# Patient Record
Sex: Male | Born: 1955 | Race: White | Hispanic: No | Marital: Married | Smoking: Former smoker
Health system: Southern US, Community
[De-identification: ages and names within clinical notes are randomized; demographics above are authoritative.]

## PROBLEM LIST (undated history)

## (undated) DIAGNOSIS — I429 Cardiomyopathy, unspecified: Secondary | ICD-10-CM

## (undated) DIAGNOSIS — I10 Essential (primary) hypertension: Secondary | ICD-10-CM

## (undated) DIAGNOSIS — E119 Type 2 diabetes mellitus without complications: Secondary | ICD-10-CM

## (undated) DIAGNOSIS — I2510 Atherosclerotic heart disease of native coronary artery without angina pectoris: Secondary | ICD-10-CM

## (undated) DIAGNOSIS — E785 Hyperlipidemia, unspecified: Secondary | ICD-10-CM

## (undated) DIAGNOSIS — I4892 Unspecified atrial flutter: Secondary | ICD-10-CM

## (undated) HISTORY — DX: Type 2 diabetes mellitus without complications: E11.9

## (undated) HISTORY — DX: Essential (primary) hypertension: I10

## (undated) HISTORY — DX: Atherosclerotic heart disease of native coronary artery without angina pectoris: I25.10

## (undated) HISTORY — DX: Hyperlipidemia, unspecified: E78.5

## (undated) HISTORY — DX: Unspecified atrial flutter: I48.92

## (undated) HISTORY — DX: Cardiomyopathy, unspecified: I42.9

---

## 2007-12-22 HISTORY — PX: CORONARY ARTERY BYPASS GRAFT: SHX141

## 2008-01-05 ENCOUNTER — Ambulatory Visit: Payer: Self-pay | Admitting: Cardiology

## 2008-01-06 ENCOUNTER — Encounter: Payer: Self-pay | Admitting: Cardiothoracic Surgery

## 2008-01-06 ENCOUNTER — Ambulatory Visit: Payer: Self-pay | Admitting: Cardiology

## 2008-01-06 ENCOUNTER — Encounter: Payer: Self-pay | Admitting: Cardiology

## 2008-01-06 ENCOUNTER — Inpatient Hospital Stay (HOSPITAL_COMMUNITY): Admission: AD | Admit: 2008-01-06 | Discharge: 2008-01-12 | Payer: Self-pay | Admitting: Cardiology

## 2008-01-06 ENCOUNTER — Ambulatory Visit: Payer: Self-pay | Admitting: Cardiothoracic Surgery

## 2008-01-07 ENCOUNTER — Encounter: Payer: Self-pay | Admitting: Cardiothoracic Surgery

## 2008-01-14 ENCOUNTER — Emergency Department (HOSPITAL_COMMUNITY): Admission: EM | Admit: 2008-01-14 | Discharge: 2008-01-14 | Payer: Self-pay | Admitting: Emergency Medicine

## 2008-01-18 ENCOUNTER — Ambulatory Visit: Payer: Self-pay | Admitting: Cardiology

## 2008-01-19 ENCOUNTER — Encounter: Payer: Self-pay | Admitting: Cardiology

## 2008-01-19 ENCOUNTER — Encounter: Payer: Self-pay | Admitting: Physician Assistant

## 2008-02-01 ENCOUNTER — Encounter: Admission: RE | Admit: 2008-02-01 | Discharge: 2008-02-01 | Payer: Self-pay | Admitting: Cardiothoracic Surgery

## 2008-02-01 ENCOUNTER — Ambulatory Visit: Payer: Self-pay | Admitting: Cardiothoracic Surgery

## 2008-02-04 ENCOUNTER — Ambulatory Visit: Payer: Self-pay | Admitting: Cardiology

## 2008-02-10 ENCOUNTER — Encounter: Payer: Self-pay | Admitting: Cardiology

## 2008-06-23 ENCOUNTER — Ambulatory Visit: Payer: Self-pay | Admitting: Cardiology

## 2008-10-06 IMAGING — CR DG CHEST 2V
2 series · 2 of 2 positions shown · non-contrast
Comparison: 01/10/2008.

CLINICAL DATA: Unstable angina.  Status post CABG.

CHEST - 2 VIEW

[w chest pa]
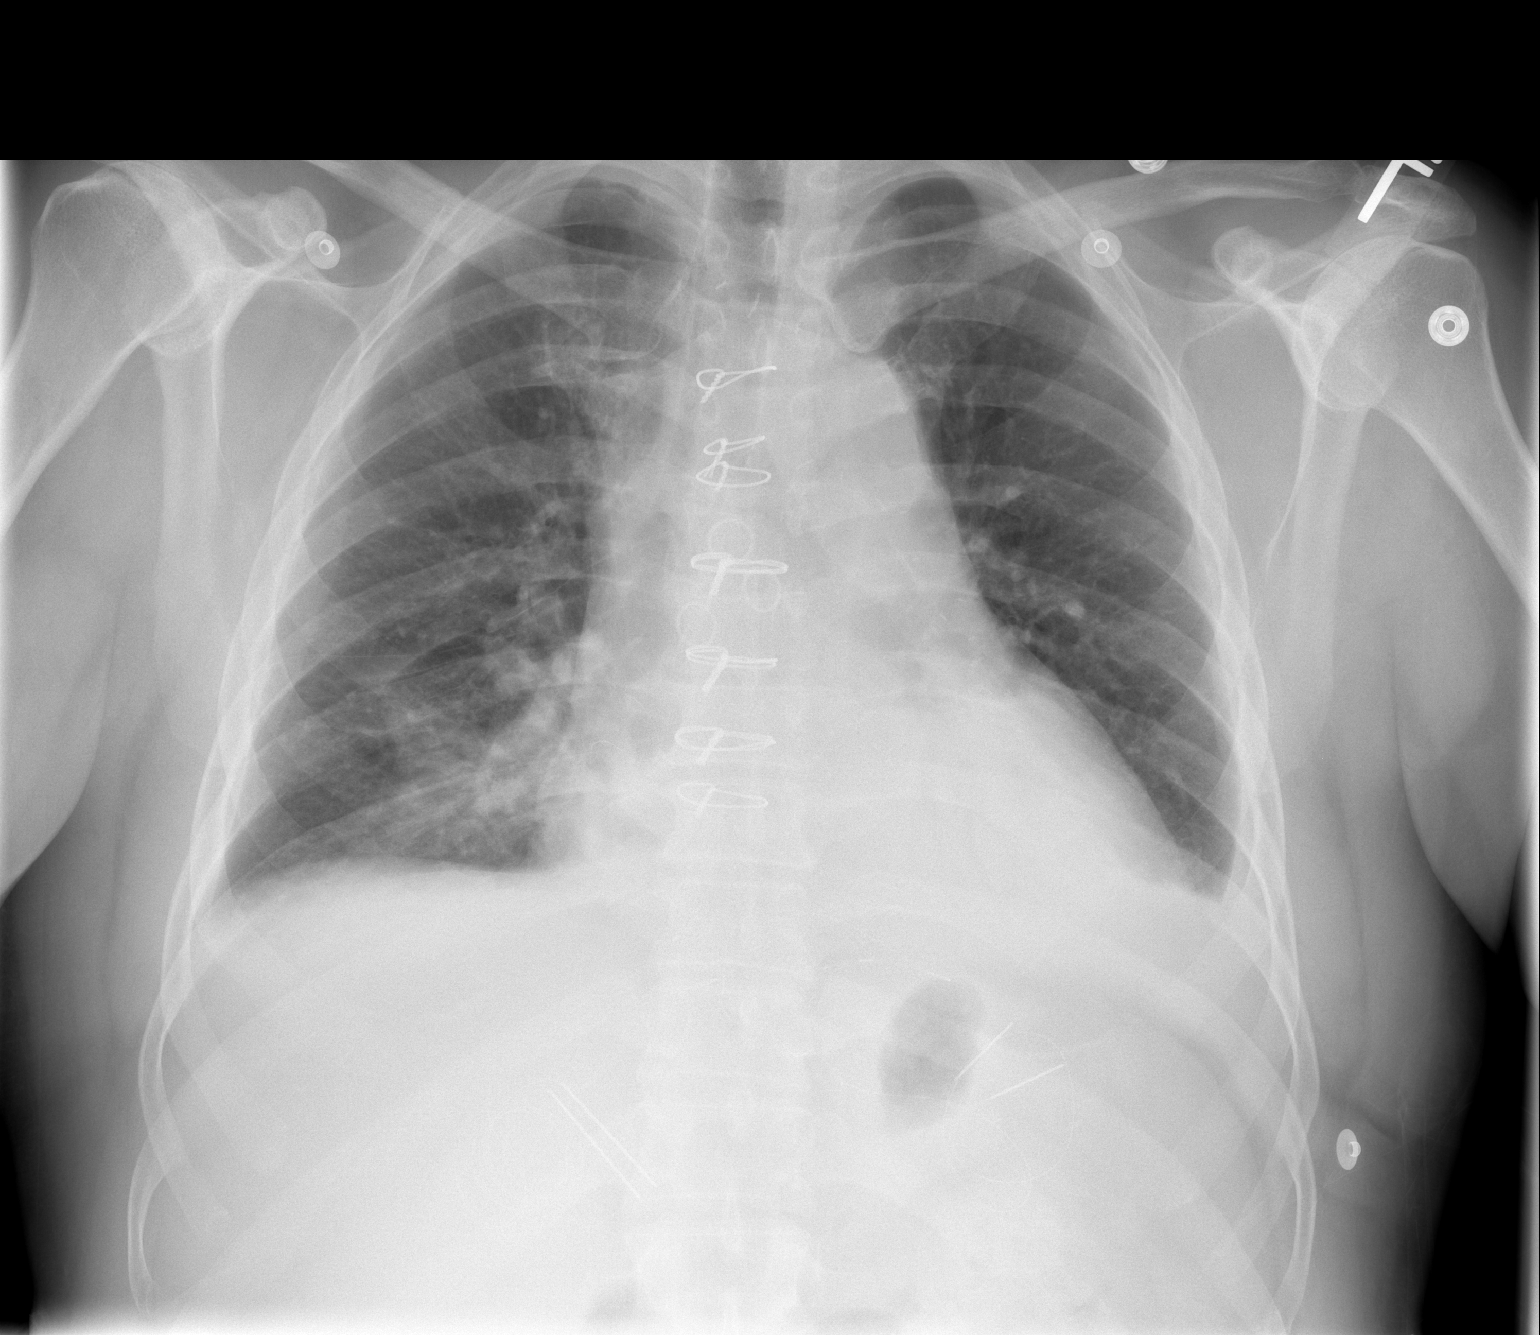

[w chest lat]
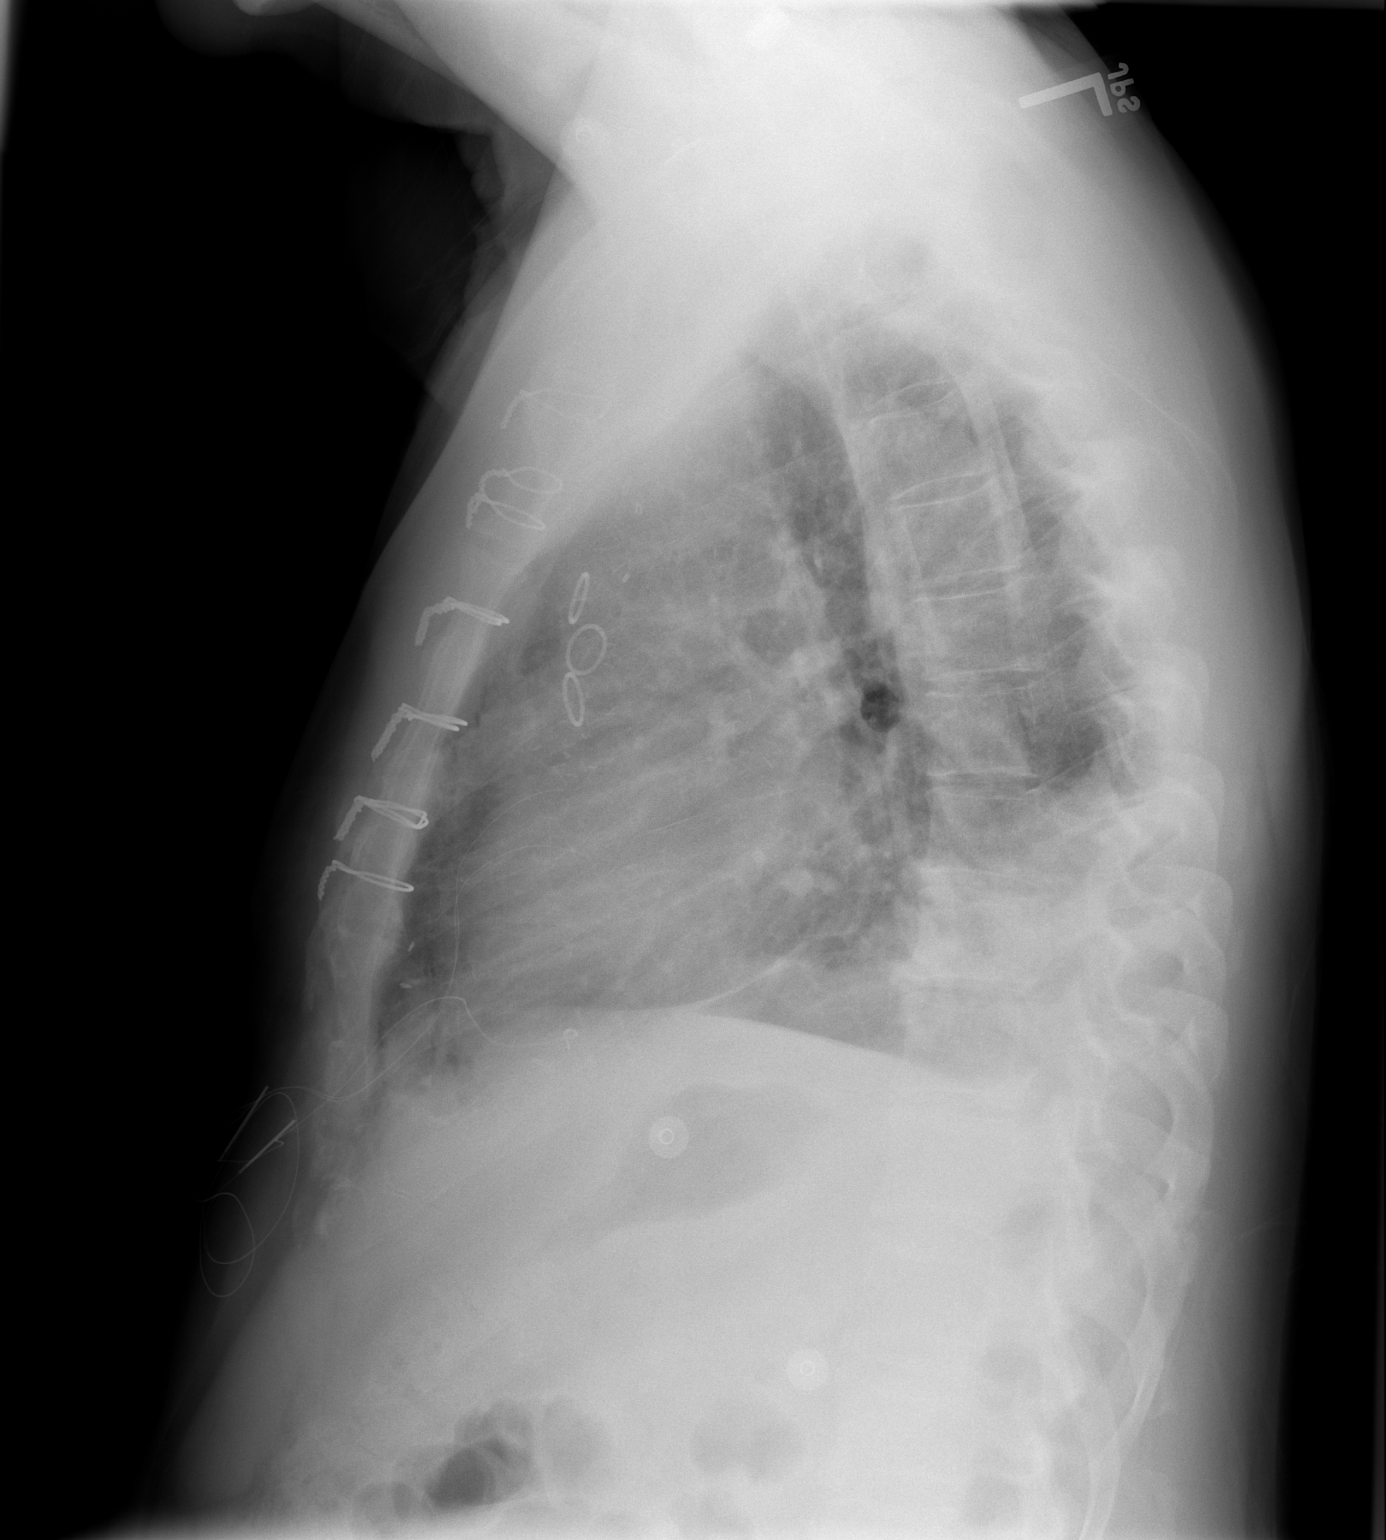

[2 of 2 positions shown; findings below may reference images not displayed]

FINDINGS: Small bilateral pleural effusions persist, left greater
than right.  There is some improvement on the left.  There is focal
consolidation in the posterior left lower lobe.  Air bronchograms
are present.  This most likely still represents atelectasis as
there is depression of the major fissure on the left.  Given the
air bronchograms, infection or even aspiration is not excluded.
External pacing wires remain.
IMPRESSION: 1.  Focal consolidation at the posterior left lung base.  This most
likely represents atelectasis but infection or aspiration is not
excluded.  Please see the above discussion.
2.  Small bilateral pleural effusions, left greater than right.
There is some improvement on the left.
3.  Minimal right basilar atelectasis.

## 2008-12-23 ENCOUNTER — Encounter: Payer: Self-pay | Admitting: Cardiology

## 2008-12-28 ENCOUNTER — Encounter (INDEPENDENT_AMBULATORY_CARE_PROVIDER_SITE_OTHER): Payer: Self-pay | Admitting: *Deleted

## 2009-01-04 DIAGNOSIS — I10 Essential (primary) hypertension: Secondary | ICD-10-CM | POA: Insufficient documentation

## 2009-01-04 DIAGNOSIS — E785 Hyperlipidemia, unspecified: Secondary | ICD-10-CM

## 2009-02-09 ENCOUNTER — Encounter: Payer: Self-pay | Admitting: Physician Assistant

## 2009-02-09 ENCOUNTER — Ambulatory Visit: Payer: Self-pay | Admitting: Cardiology

## 2009-05-12 ENCOUNTER — Encounter (INDEPENDENT_AMBULATORY_CARE_PROVIDER_SITE_OTHER): Payer: Self-pay | Admitting: *Deleted

## 2009-05-19 ENCOUNTER — Encounter: Payer: Self-pay | Admitting: Cardiology

## 2009-05-25 ENCOUNTER — Encounter (INDEPENDENT_AMBULATORY_CARE_PROVIDER_SITE_OTHER): Payer: Self-pay | Admitting: *Deleted

## 2009-08-31 ENCOUNTER — Encounter (INDEPENDENT_AMBULATORY_CARE_PROVIDER_SITE_OTHER): Payer: Self-pay | Admitting: *Deleted

## 2009-09-08 ENCOUNTER — Encounter: Payer: Self-pay | Admitting: Cardiology

## 2009-09-12 ENCOUNTER — Encounter (INDEPENDENT_AMBULATORY_CARE_PROVIDER_SITE_OTHER): Payer: Self-pay | Admitting: *Deleted

## 2009-10-06 ENCOUNTER — Ambulatory Visit: Payer: Self-pay | Admitting: Cardiology

## 2009-10-06 DIAGNOSIS — I2510 Atherosclerotic heart disease of native coronary artery without angina pectoris: Secondary | ICD-10-CM

## 2010-02-28 ENCOUNTER — Encounter: Payer: Self-pay | Admitting: Cardiology

## 2010-03-02 ENCOUNTER — Encounter: Payer: Self-pay | Admitting: Cardiology

## 2010-03-05 ENCOUNTER — Encounter (INDEPENDENT_AMBULATORY_CARE_PROVIDER_SITE_OTHER): Payer: Self-pay | Admitting: *Deleted

## 2010-04-02 ENCOUNTER — Encounter: Payer: Self-pay | Admitting: Cardiology

## 2010-04-02 ENCOUNTER — Ambulatory Visit: Payer: Self-pay | Admitting: Cardiology

## 2010-05-22 NOTE — Miscellaneous (Signed)
Summary: Orders Update  Clinical Lists Changes  Orders: Added new Test order of T-Lipid Profile ([id number]) - Signed Added new Test order of T-Hepatic Function ([id number]) - Signed 

## 2010-05-22 NOTE — Assessment & Plan Note (Signed)
Summary: 1 YR FU PER JUNE REMINDER-SRS   Visit Type:  Follow-up Primary Provider:  Dr. Doreen Beam   History of Present Illness: 55 year old male presents for followup. He is here with his wife. He denies any significant problems of angina or unusual breathlessness. He reports compliance with medications.  Followup labs from 20 May reveal AST 19, ALT 25, cholesterol 128, triglycerides 61, HDL 31, LDL 85. We reviewed these today. These levels were noted on increased dose Lipitor at 40 mg daily. He states that he has had some weakness and myalgias on the higher dose.  He reports no problems with palpitations or syncope. Appetite stable.  Preventive Screening-Counseling & Management  Alcohol-Tobacco     Smoking Status: quit     Year Quit: 1985  Current Medications (verified): 1)  Metoprolol Tartrate 25 Mg Tabs (Metoprolol Tartrate) .... Take 1 Tablet By Mouth Twice A Day 2)  Lipitor 40 Mg Tabs (Atorvastatin Calcium) .... Take One Tablet By Mouth Daily. 3)  Lisinopril 2.5 Mg Tabs (Lisinopril) .... Take 1 Tablet By Mouth Once A Day 4)  Glucovance 5-500 Mg Tabs (Glyburide-Metformin) .... Take 2 Tablet By Mouth Two Times A Day 5)  Aspirin 81 Mg Tbec (Aspirin) .... Take One Tablet By Mouth Daily 6)  Allegra 180 Mg Tabs (Fexofenadine Hcl) .... Take 1 Tablet By Mouth Once A Day 7)  Fish Oil 1000 Mg Caps (Omega-3 Fatty Acids) .... Take 1 Capsule By Mouth Two Times A Day 8)  Protonix 40 Mg Tbec (Pantoprazole Sodium) .... Take 1 Tablet By Mouth Once A Day  Allergies (verified): 1)  * Lasix 2)  Potassium (Potassium)  Comments:  Nurse/Medical Assistant: The patient's medication list and allergies were reviewed with the patient and were updated in the Medication and Allergy Lists.  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 Hemodynamics; aorta 115/66, left ventricle 116/4/11   2. Left ventriculogram: EF is 60% with no wall motion abnormalities.   3. Coronary angiography, the coronary system is right dominant.  There       is a 30%-40% proximal RCA lesion and a 50%-60% mid RCA lesion.  The       left main has no significant disease.  The proximal LAD has a long       diseased segment with stenosis up to 70% that extends from the       ostial LAD down past the second diagonal.  The first diagonal has a       99% proximal stenosis and the second diagonal which is a smaller       vessel has a 90% proximal stenosis.  More distally, the LAD has a       40%-50% stenosis.  In the circumflex system, the first obtuse       marginal has serial proximal 60% stenoses and the second obtuse       marginal has a proximal 60% stenosis and a more mid 50% stenosis.       The third obtuse marginal was a smaller vessel with the proximal       70% stenosis.  The distal AV circumflex is subtotally occluded.  ASSESSMENT AND PLAN:  This is a diabetic who presented with unstable   angina and was found to have severe coronary artery disease.  There is a   proximal long left anterior descending coronary artery stenosis that   reaches up to at least 70% along with severe stenoses in the first and   second diagonals and moderate stenoses in several obtuse marginals.   Given the patient's risk factors, symptoms, and diffuse disease with   reasonable distal targets, I think coronary artery bypass grafting would   be his best option.  We will consult the CT surgery service.      Marca Ancona, MD  (01/06/2008)    Family History: Family History of Coronary Artery Disease  Social History: Full Time Married  Tobacco Use - Former Alcohol  Use - no  Review of Systems  The patient denies anorexia, fever, chest pain, syncope, dyspnea on exertion, peripheral edema, headaches, melena, and hematochezia.         Otherwise reviewed and negative except as outlined.  Vital Signs:  Patient profile:   55 year old male Height:      65 inches Weight:      176 pounds Pulse rate:   66 / minute BP sitting:   121 / 75  (left arm) Cuff size:   large  Vitals Entered By: Carlye Grippe (October 06, 2009 3:21 PM)  Physical Exam  Additional Exam:  Normally nourished appearing male in no acute distress. HEENT: Conjunctiva and lids normal, oropharynx with moist mucosa. Neck: Supple, no elevated JVP. Cardiac: Regular rate and rhythm, no S3. Thorax: Stable sternum post sternotomy. Abdomen: Soft, nontender, bowel sounds present. Skin: Warm and dry. Extremities: No pitting edema. Musculoskeletal: No gross deformities. Neuropsychiatric: Alert and oriented x3, affect appropriate.   EKG  Procedure date:  10/06/2009  Findings:      Normal sinus rhythm at 63 beats per minute.  Impression & Recommendations:  Problem # 1:  CORONARY ATHEROSCLEROSIS NATIVE CORONARY ARTERY (ICD-414.01)  Symptomatically stable, approximately 2 years out from bypass grafting. He is doing well on present medications. I encouraged him to continue regular exercise and diet. I will see him back in 6 months.  His updated medication list for this problem includes:    Metoprolol Tartrate 25 Mg Tabs (Metoprolol tartrate) .Marland Kitchen... Take 1 tablet by mouth twice a day    Lisinopril 2.5 Mg Tabs (Lisinopril) .Marland Kitchen... Take 1 tablet by mouth once a day    Aspirin 81 Mg Tbec (Aspirin) .Marland Kitchen... Take one tablet by mouth daily  Orders: EKG w/ Interpretation (93000)  Problem # 2:  HYPERTENSION, UNSPECIFIED (ICD-401.9)  Blood pressure well-controlled.  His updated medication list for this problem includes:    Metoprolol Tartrate 25 Mg Tabs (Metoprolol tartrate) .Marland Kitchen... Take 1 tablet  by mouth twice a day    Lisinopril 2.5 Mg Tabs (Lisinopril) .Marland Kitchen... Take 1 tablet by mouth once a day    Aspirin 81 Mg Tbec (Aspirin) .Marland Kitchen... Take one tablet by mouth daily  Problem # 3:  HYPERLIPIDEMIA-MIXED (ICD-272.4)  Better controlled on Lipitor 40 mg daily, however he is reporting increased weakness and myalgias. I asked him to cut back to 20 mg daily. Will repeat lipid profile and liver function tests in 12 weeks. If LDL is not in good order, we may need to switch to a different statin preparation.  His updated medication list for this problem includes:    Lipitor 20 Mg Tabs (Atorvastatin calcium) .Marland Kitchen... Take one tablet by  mouth daily.  Patient Instructions: 1)  Your physician wants you to follow-up in: 6 months. You will receive a reminder letter in the mail one-two months in advance. If you don't receive a letter, please call our office to schedule the follow-up appointment. 2)  Decrease Lipitor to 20mg  by mouth per day. You may take 1/2 of your 40mg  tablets.  3)  Your physician recommends that you go to the [facility] for a FASTING lipid profile and liver function labs: DO LABS IN 12 WEEKS. Prescriptions: LIPITOR 20 MG TABS (ATORVASTATIN CALCIUM) Take one tablet by mouth daily.  #30 x 6   Entered by:   Cyril Loosen, RN, BSN   Authorized by:   Loreli Slot, MD, [facility]   Signed by:   Cyril Loosen, RN, BSN on 10/06/2009   Method used:   Electronically to        [facility] [address] (retail)       [address]       [place], [place]  [address]       Ph: [phone] or [phone]       Fax: [phone]   RxID:   [id number]

## 2010-05-22 NOTE — Miscellaneous (Signed)
Summary: FLP/LFT  Clinical Lists Changes  Orders: Added new Test order of T-Hepatic Function [phone]) - Signed Added new Test order of T-Lipid Profile [phone]) - Signed

## 2010-05-22 NOTE — Letter (Signed)
Summary: Generic Engineer, agricultural at [facility] [address]   [place], [place] [address]   Phone: [phone]  Fax: [phone]        May 25, 2009 MRN: [id number]    [name] [address] [place], [place]  [address]    Dear Mr. CRESCENZO,   Please, contact our office as soon as possible regarding your test results. We attempted to reach you by phone, but apparently, we do not have a correct contact number for you.        Sincerely,  Cyril Loosen, RN, BSN  This letter has been electronically signed by your physician.

## 2010-05-22 NOTE — Letter (Signed)
Summary: Engineer, materials at [facility]  [address]   [place], [place] [address]   Phone: [phone]  Fax: [phone]        Sep 12, 2009 MRN: [id number]    [facility] [address] [place], [place]  [address]    Dear Mr. CARLISI,  Your test ordered by Selena Batten has been reviewed by your physician (or physician assistant) and was found to be normal or stable. Your physician (or physician assistant) felt no changes were needed at this time.  ____ Echocardiogram  ____ Cardiac Stress Test  _X___ Lab Work-LDL (bad cholesterol) looks much better. Liver function looks okay. Continue same medications.  ____ Peripheral vascular study of arms, legs or neck  ____ CT scan or X-ray  ____ Lung or Breathing test  ____ Other:   Thank you.   Cyril Loosen, RN, BSN    Duane Boston, M.D., F.A.C.C. Thressa Sheller, M.D., F.A.C.C. Oneal Grout, M.D., F.A.C.C. Cheree Ditto, M.D., F.A.C.C. Daiva Nakayama, M.D., F.A.C.C. Kenney Houseman, M.D., F.A.C.C. Jeanne Ivan, PA-C

## 2010-05-22 NOTE — Miscellaneous (Signed)
Summary: FLP/LFT  Clinical Lists Changes  Orders: Added new Test order of T-Hepatic Function ([id number]) - Signed Added new Test order of T-Lipid Profile ([id number]) - Signed 

## 2010-05-22 NOTE — Letter (Signed)
Summary: Engineer, materials at [facility]  [address]   [place], [place] [address]   Phone: [phone]  Fax: [phone]        March 05, 2010 MRN: [id number]   [facility] [address] [place], [place]  [address]   Dear Mr. WEANT,  Your test ordered by Selena Batten has been reviewed by your physician (or physician assistant) and was found to be normal or stable. Your physician (or physician assistant) felt no changes were needed at this time.  ____ Echocardiogram  ____ Cardiac Stress Test  __X__ Lab Work  ____ Peripheral vascular study of arms, legs or neck  ____ CT scan or X-ray  ____ Lung or Breathing test  ____ Other:   Thank you.   Cyril Loosen, RN, BSN    Duane Boston, M.D., F.A.C.C. Thressa Sheller, M.D., F.A.C.C. Oneal Grout, M.D., F.A.C.C. Cheree Ditto, M.D., F.A.C.C. Daiva Nakayama, M.D., F.A.C.C. Kenney Houseman, M.D., F.A.C.C. Jeanne Ivan, PA-C

## 2010-05-24 NOTE — Assessment & Plan Note (Signed)
Summary: 6 MO FUL   Visit Type:  Follow-up Primary Provider:  Dr. Doreen Beam   History of Present Illness: 55 year old male presents for followup. He was seen back in June. Reports doing well without angina or unusual shortness of breath.  Followup labs from 11 November showed AST 20, ALT 26, cholesterol 132, triglycerides 157, HDL 31, LDL 70. We reviewed these.  Patient reports compliance with his medications. 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  Has mainly arthritic/orthopedic complaints.  Preventive Screening-Counseling & Management  Alcohol-Tobacco     Smoking Status: quit     Year Quit: 1985  Current Medications (verified): 1)  Metoprolol Tartrate 25 Mg Tabs (Metoprolol Tartrate) .... Take 1 Tablet By Mouth Twice A Day 2)  Lipitor 20 Mg Tabs (Atorvastatin Calcium) .... Take One Tablet By Mouth Daily. 3)  Lisinopril 5 Mg Tabs (Lisinopril) .... Take 1 Tablet By Mouth Once A Day 4)  Glucovance 5-500 Mg Tabs (Glyburide-Metformin) .... Take 2 Tablet By Mouth Two Times A Day 5)  Aspirin 81 Mg Tbec (Aspirin) .... Take One Tablet By Mouth Daily 6)  Allegra 180 Mg Tabs (Fexofenadine Hcl) .... Take 1 Tablet By Mouth Once A Day As Needed 7)  Fish Oil 1000 Mg Caps (Omega-3 Fatty Acids) .... Take 1 Capsule By Mouth Two Times A Day 8)  Protonix 40 Mg Tbec (Pantoprazole Sodium) .... Take 1 Tablet By Mouth Once A Day 9)  Januvia 50 Mg Tabs (Sitagliptin Phosphate) .... Take 1 Tablet By Mouth Two Times A Day 10)  Novolog Flexpen 100 Unit/ml Soln (Insulin Aspart) .... Use As Directed  Allergies (verified): 1)  * Lasix 2)  Potassium (Potassium)  Comments:  Nurse/Medical Assistant: The patient's medications and allergies were verbally reviewed with the patient and were updated in the Medication and Allergy Lists.  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         Otherwise reviewed and negative except as outlined.  Vital Signs:  Patient profile:   55 year old male Height:      65 inches Weight:      177 pounds BMI:     29.56 Pulse rate:   80 / minute BP sitting:   138 / 75  (left arm) Cuff size:   regular  Vitals Entered By: Carlye Grippe (April 02, 2010 3:46 PM)  Nutrition Counseling: Patient's BMI is greater than 25 and therefore counseled on weight management options.  Physical Exam  Additional Exam:  Normally nourished appearing male in no acute distress. HEENT: Conjunctiva and lids normal, oropharynx with moist mucosa. Neck: Supple, no elevated JVP. Cardiac: Regular rate and rhythm, no S3. Thorax: Stable sternum post sternotomy. Abdomen: Soft, nontender, bowel sounds present. Skin: Warm and dry. Extremities: No pitting edema. Musculoskeletal: No gross deformities. Neuropsychiatric: Alert and oriented x3, affect appropriate.   EKG  Procedure date:  04/02/2010  Findings:      Sinus rhythm at 70 beats per minute.  Impression & Recommendations:  Problem # 1:  CORONARY ATHEROSCLEROSIS NATIVE CORONARY ARTERY (ICD-414.01)  Stable  symptomatically, doing very well. No medication changes made. I will see him back in 6 months.  His updated medication list for this problem includes:    Metoprolol Tartrate 25 Mg Tabs (Metoprolol tartrate) .Marland Kitchen... Take 1 tablet by mouth twice a day    Lisinopril 5 Mg Tabs (Lisinopril) .Marland Kitchen... Take 1 tablet by mouth once a day    Aspirin 81 Mg Tbec (Aspirin) .Marland Kitchen... Take one tablet by mouth daily  Orders: EKG w/ Interpretation (93000)  Problem #  2:  HYPERTENSION, UNSPECIFIED (ICD-401.9)  Blood pressure up some today, although typically much better controlled. Continue exercise, sodium restriction.  His updated medication list for this problem includes:    Metoprolol Tartrate 25 Mg Tabs (Metoprolol tartrate) .Marland Kitchen... Take 1 tablet by mouth twice a day    Lisinopril 5 Mg Tabs (Lisinopril) .Marland Kitchen... Take 1 tablet by mouth once a day    Aspirin 81 Mg Tbec (Aspirin) .Marland Kitchen... Take one tablet by mouth daily  Problem # 3:  HYPERLIPIDEMIA-MIXED (ICD-272.4)  Lipids looked good back in November. Followup fasting lipid profile and liver function tests for next visit.  His updated medication list for this problem includes:    Lipitor 20 Mg Tabs (Atorvastatin calcium) .Marland Kitchen... Take one tablet by mouth daily.  Patient Instructions: 1)  Your physician wants you to follow-up in: 6 months. You will receive a reminder letter in the mail one-two months in advance. If you don't receive a letter, please call our office to schedule the follow-up appointment. 2)  Your physician recommends that you continue on your current medications as directed. Please refer to the Current Medication list given to you today. 3)  Your physician recommends that you go to the [facility] for a FASTING lipid profile and liver function labs:  DO IN 6 MONTHS BEFORE APPT. Do not eat or drink after midnight.

## 2010-07-18 ENCOUNTER — Other Ambulatory Visit: Payer: Self-pay | Admitting: *Deleted

## 2010-07-18 DIAGNOSIS — I2510 Atherosclerotic heart disease of native coronary artery without angina pectoris: Secondary | ICD-10-CM

## 2010-07-18 MED ORDER — METOPROLOL TARTRATE 25 MG PO TABS: 25.0000 mg | ORAL_TABLET | Freq: Two times a day (BID) | ORAL | Status: DC

## 2010-07-18 NOTE — Telephone Encounter (Signed)
Wife informed that will be sent to pharmacy

## 2010-08-22 ENCOUNTER — Other Ambulatory Visit: Payer: Self-pay | Admitting: *Deleted

## 2010-08-22 ENCOUNTER — Telehealth: Payer: Self-pay | Admitting: *Deleted

## 2010-08-22 DIAGNOSIS — E785 Hyperlipidemia, unspecified: Secondary | ICD-10-CM

## 2010-08-22 DIAGNOSIS — Z79899 Other long term (current) drug therapy: Secondary | ICD-10-CM

## 2010-08-22 NOTE — Telephone Encounter (Signed)
Pt's wife left message on voicemail stating that pt needs labs done before June appt. She would like order sent to the [facility] and a call to notify her when this is done.   Left message to notify pt's wife that order is being sent and that he needs to go to the [facility] towards the end of May (fasting) to have labs done.

## 2010-09-04 NOTE — Assessment & Plan Note (Signed)
OFFICE VISIT   [name], [name]  DOB:  1955/10/18                                        February 01, 2008  CHART #:  [id number]   The patient comes in today for postoperative followup.  He is status  post CABG x6 on January 08, 2008.  His postoperative course was  uneventful and he was able to be discharged home on January 12, 2008,  without complications.  Since his discharge, he has continued to  progress nicely.  He states he is walking frequently at home and is  having no shortness of breath or chest discomfort.  His only problem  since returning home was severe nausea and vomiting, which seemed to be  related to the potassium supplement.  He was discharged home on Lasix  and potassium for 5 days and probably completed 3 days' worth of both.  However, he was becoming so sick after taking the medication that he  contacted Dr. Margarita Mail office and was told to discontinue the  medication.  He has not noted any increase in his weight, increased  shortness of breath, or lower extremity edema.  His weight has actually  been trending downward since his discharge home.  He does have an  appointment to see Dr. Andee Lineman this week and a followup appointment with  his primary care physician next week.   PHYSICAL EXAMINATION:  VITAL SIGNS:  Blood pressure is 114/71, pulse is  67, respirations 18, and O2 sat 99% on room air.  CHEST:  His sternal incision is well healed.  He does have a small  suture tag at the midportion of his incision, which is removed without  difficulty.  There is no erythema or drainage.  His right lower  extremity EVH incisions are well healed.  HEART:  Regular rate and rhythm without murmurs, rubs, or gallops.  LUNGS:  Clear, although breath sounds was slightly diminished in the  left base.  EXTREMITIES:  There is trace right lower extremity edema and no edema on  the left.   Chest x-ray today shows a small left pleural effusion, but this  is  markedly improved from a previous study.   ASSESSMENT AND PLAN:  The patient is doing very well, status post  coronary artery bypass graft.  He has asked for a prescription for  Lipitor as he is about to run out.  He does have an appointment to see  Dr. Andee Lineman this week and I have given him a prescription for 30 Lipitor  20 mg tablets until he gets back in with the cardiologist.  I have  encouraged him to proceed with cardiac rehab in which he is already  enrolled in.  We have discussed his returning to work.  He has been  working on a part-time temporary basis for the [employer], which does involve some outdoor work, including lifting.  He  feels that he may be able to obtain a light duty position until his  sternal restrictions are listed.  He will look into this and let us know  if he needs a note for work.  Otherwise, he is doing very well and we  will plan to see him back on an as-needed basis.  He is asked to call if  he experiences any problems or has questions.  Sheliah Plane, MD  Electronically Signed   GC/MEDQ  D:  02/01/2008  T:  02/02/2008  Job:  [id number]   cc:   Learta Codding, MD,FACC  Doreen Beam, MD

## 2010-09-04 NOTE — Op Note (Signed)
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   POSTOPERATIVE DIAGNOSIS:  Coronary artery occlusive disease with  unstable angina.   SURGICAL PROCEDURE:  Coronary artery bypass grafting x6 with the left  internal mammary to the left anterior descending coronary artery, triple  sequential reverse saphenous vein graft to the first diagonal, first  obtuse marginal and second obtuse marginal reverse saphenous vein graft  to the second diagonal coronary artery, and reverse saphenous vein graft  to the posterior descending coronary artery with right thigh endovein  harvesting.   SURGEON:  Sheliah Plane, MD   FIRST ASSISTANT:  Stephanie Acre. Dominick, PA   BRIEF HISTORY:  The patient is a 55 year old male who presents with  unstable anginal symptoms.  He underwent cardiac catheterization by  [facility] and was found to have severe three-vessel coronary  artery disease with high-grade obstruction of his LAD, first and second  diagonal, circumflex, and right coronary artery.  Coronary artery bypass  grafting was recommended to the patient who agreed and signed informed  consent.   DESCRIPTION OF THE PROCEDURE:  With Swan-Ganz, arterial line, and  monitors in place, the patient underwent general endotracheal anesthesia  without incidence given the chest and legs prepped with Betadine and  draped in usual sterile manner.  Using the Guidant endovein harvesting  system, vein was harvested from the right thigh and lower leg.  Vein was  of good quality and caliber.  Median sternotomy was  performed.  Left  internal mammary artery was dissected down as a pedicle graft.  The  distal artery was divided, had good free flow.  Pericardium was opened.  Overall, ventricular function appeared preserved.  The patient was  systemically heparinized.  The ascending aorta was cannulated and aortic  root vent, cardioplegia needle was introduced into the ascending aorta  and the right atrium cannulated.  The patient was placed on  cardiopulmonary bypass at 2.4 L per minute per meter squared.  Sites of  anastomosis were inspected and dissected out of the epicardium.  The  patient's body temperature was cooled to 30 degrees.  Aortic cross-clamp  was applied.  A 500 mL of cold blood potassium cardioplegia was  administered with rapid diastolic arrest of the heart.  Myocardial  septal temperature was monitored throughout the cross-clamp.  Attention  was turned first to the posterior descending coronary artery, which was  opened, was diffusely diseased, a small poor quality vessel.  Using a  running 7-0 Prolene, distal anastomosis was performed.  Attention was  then turned to the left lateral wall.  The first diagonal, which was  high lateral wall was moderate-size vessel admitted a 1.5-mm probe.  The  first obtuse marginal, second obtuse marginals were small vessels.  The  distal circumflex was very small, not suitable for bypass.  The  alignment of the vessels made it most feasible to do a triple sequential  graft.  With these, a first obtuse marginal artery was bypassed first  with a side-to-side anastomosis with second reverse saphenous vein  graft.  The distal extent of this same vein was then trimmed and  anastomosed to the second obtuse marginal.  An 8-0 Prolene was used  because of the small size and thinness of each of the vessels.  A second  side-to-side anastomosis with the first diagonal was performed again  with a running 8-0 Prolene.  Additional cold blood cardioplegia was   administered down the vein grafts.  The second diagonal coronary artery  was opened and was 1.2-1.3 mm in size.  Using a running 7-0 Prolene,  distal anastomosis was performed.  Attention was then turned to the left  anterior descending coronary artery, which was a small vessel but  admitted a 1-mm probe distally.  Using a running 8-0 Prolene, left  internal mammary artery was anastomosed to left anterior descending  coronary artery.  With cross-clamp still in place, 3 punch aortotomies  were performed in the ascending aorta and each of the 3 vein grafts were  anastomosed to the ascending aorta.  Air was evacuated from the grafts  and ascending aorta.  Aortic cross-clamp was removed.  Total cross-clamp  time of 109 minutes.  The patient converted to a sinus rhythm.  Sites of  anastomosis were inspected, free of bleeding.  Atrial and ventricular  pacing wires were applied.  The patient was then ventilated and weaned  from cardiopulmonary bypass without difficulty, remained hemodynamically  stable, was decannulated in usual fashion.  Total pump time was 150  minutes.  The patient was decannulated in usual fashion.  Protamine  sulfate was administered with operative field hemostatic.  A left  pleural tube and a Blake mediastinal drain were left in place.  Pericardium was reapproximated.  Sternum closed with a #6 stainless  steel wire.  Fascia closed with interrupted 0 Vicryl, running 3-0 Vicryl  in subcutaneous tissue, 4-0 subcuticular stitch in skin edges.  Dry  dressings were applied.  Sponge and needle count was reported as correct  at the completion of the procedure.  The patient tolerated the procedure  without obvious complication.  Overall, the patient had very small  distal vessels and diffuse disease consistent with his significant  diabetes.      Sheliah Plane, MD  Electronically Signed     EG/MEDQ  D:  01/12/2008  T:  01/13/2008  Job:  [id number]   cc:   Luis Abed,  MD, [facility]

## 2010-09-04 NOTE — Cardiovascular Report (Signed)
NAMECHANANYA, CANIZALEZ NO.:  [id number]   MEDICAL RECORD NO.:  [id number]          PATIENT TYPE:  INP   LOCATION:  3735                         FACILITY:  [facility]   PHYSICIAN:  Marca Ancona, MD      DATE OF BIRTH:  04/01/1956   DATE OF PROCEDURE:  01/06/2008  DATE OF DISCHARGE:                            CARDIAC CATHETERIZATION   INDICATIONS:  Unstable angina in a patient with diabetes, hypertension,  and hyperlipidemia.   PROCEDURES:  1. Left heart catheterization.  2. Coronary angiography.  3. Left ventriculography.   PROCEDURE NOTE:  After informed consent was obtained, the patient's  right groin was sterilely prepped and draped.  Lidocaine 1% was used to  locally anesthetize the right groin area.  The right common femoral  artery was accessed using Seldinger technique and a 6-French arterial  sheath was placed in the right common femoral artery.  The right  coronary artery and left coronary artery were engaged with the 6-French  multipurpose catheter.  The left ventricle was entered with the 6-French  multipurpose catheter.  Additional pictures of the left coronary artery  were obtained with the 6-French JL-4 catheter.  There were no  complications.   FINDINGS:  1. Hemodynamics; aorta 115/66, left ventricle 116/4/11  2. Left ventriculogram: EF is 60% with no wall motion abnormalities.  3. Coronary angiography, the coronary system is right dominant.  There      is a 30%-40% proximal RCA lesion and a 50%-60% mid RCA lesion.  The      left main has no significant disease.  The proximal LAD has a long      diseased segment with stenosis up to 70% that extends from the      ostial LAD down past the second diagonal.  The first diagonal has a      99% proximal stenosis and the second diagonal which is a smaller      vessel has a 90% proximal stenosis.  More distally, the LAD has a      40%-50% stenosis.  In the circumflex system, the first obtuse      marginal  has serial proximal 60% stenoses and the second obtuse      marginal has a proximal 60% stenosis and a more mid 50% stenosis.      The third obtuse marginal was a smaller vessel with the proximal      70% stenosis.  The distal AV circumflex is subtotally occluded.   ASSESSMENT AND PLAN:  This is a diabetic who presented with unstable  angina and was found to have severe coronary artery disease.  There is a  proximal long left anterior descending coronary artery stenosis that  reaches up to at least 70% along with severe stenoses in the first and  second diagonals and moderate stenoses in several obtuse marginals.  Given the patient's risk factors, symptoms, and diffuse disease with  reasonable distal targets, I think coronary artery bypass grafting would  be his best option.  We will consult the CT surgery service.      Marca Ancona, MD  Electronically Signed    DM/MEDQ  D:  01/06/2008  T:  01/07/2008  Job:  [id number]

## 2010-09-04 NOTE — Assessment & Plan Note (Signed)
[facility]                          [facility] OFF[facility] NOTE   NAME:Deakins, SHAKEEM STERN                       MRN:          [id number]  DATE:06/23/2008                            DOB:          March 28, 1956    PRIMARY CARE PHYSICIAN:  Doreen Beam, MD   REASON FOR VISIT:  Routine followup.   HISTORY OF PRESENT ILLNESS:  I saw Mr. Husby back in October.  He is  recuperating well status post coronary artery bypass grafting in  September 2009.  He completed cardiac rehabilitation and is now  exercising on his own.  He states that he walked approximately 5 miles  yesterday.  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  We talked about ultimately his weight lifting  restriction being approximately 50 pounds.  He has not had followup  lipids or liver function tests.  He is on Lipitor, although he has  tolerated the medicine well.  I note that this blood pressure is up  today, and he states that it was up some as he progressed through  cardiac rehabilitation.  Otherwise, he has been feeling fairly well.   ALLERGIES:  Intolerance to LASIX and POTASSIUM.   PRESENT MEDICATIONS:  1. Enteric-coated aspirin 325 mg p.o. daily.  2. Lopressor 25 mg p.o. b.i.d.  3. Glucovance 5/500 mg 2 tablets p.o. b.i.d.  4. Lisinopril 2.5 mg p.o. daily.  5. Lipitor 20 mg p.o. nightly.   REVIEW OF SYSTEMS:  As outlined above.  Otherwise negative.   PHYSICAL EXAMINATION:  VITAL SIGNS:  Blood pressure is 142/80, heart  rate is 69, weight is 175 pounds.  The patient is comfortable and in no  acute distress.  HEENT:  Conjunctiva is normal.  Oropharynx is clear.  NECK:  Supple.  No elevated jugular venous pressure.  No loud bruits.  No thyromegaly is noted.  LUNGS:  Clear without labored breathing at rest.  CARDIAC:  Regular rate and rhythm.  No S3 gallop or pericardial rub.  Chest wall is stable.  Sternal incision is well healed.  ABDOMEN:   Soft, nontender.  EXTREMITIES:  No pitting edema.  Distal pulses are 2+.  SKIN:  Warm and dry.  MUSCULOSKELETAL:  No kyphosis noted.  NEUROPSYCHIATRIC:  The patient is alert and oriented x3.  Affect is  appropriate.   IMPRESSION AND RECOMMENDATION:  Multivessel cardiovascular disease  status post coronary artery bypass grafting in September 2009 with  overall preserved ejection fraction.  He is recuperating well.  I have  asked him to keep an eye on his blood pressure.  He is due to see Dr.  Sherril Croon next month.  Would consider further up titration of lisinopril if  his blood pressure remains elevated.  Also recommended salt restriction  and regular exercise.  He is due for followup lipid profile and liver  function tests aiming for aggressive LDL control around 70.  I will plan  to see him back clinically over the next 6 months.     Jonelle Sidle, MD  Electronically Signed  SGM/MedQ  DD: 06/23/2008  DT: 06/24/2008  Job #: [id number]   cc:   Doreen Beam, MD

## 2010-09-04 NOTE — Consult Note (Signed)
NAMELYLE, NIBLETT NO.:  [id number]   MEDICAL RECORD NO.:  [id number]          PATIENT TYPE:  INP   LOCATION:  3735                         FACILITY:  [facility]   PHYSICIAN:  Sheliah Plane, MD    DATE OF BIRTH:  1955/06/17   DATE OF CONSULTATION:  01/06/2008  DATE OF DISCHARGE:                                 CONSULTATION   REQUESTING PHYSICIAN:  Dr. Shirlee Latch.   FOLLOWUP CARDIOLOGIST:  Luis Abed, MD, [facility]   PRIMARY CARE PHYSICIAN:  Doreen Beam, MD in [place].   REASON FOR CONSULTATION:  Coronary occlusive disease with unstable  angina.   HISTORY OF PRESENT ILLNESS:  The patient is a 55 year old male with  known coronary occlusive disease.  The patient had evaluation of chest  pain in November 2003 and stress echocardiogram was done at that time  and was reportedly negative.  The patient notes that he has had chest  pain for several years, but 3 days ago while working, he had felt  shortness of breath with fatigue and while walking up a hill had severe  chest pain, the worse that he has had associated with nausea and  vomiting.  He noted after vomiting, he felt much better and then  proceeded to dig a postal for a son, but because of the symptoms, he  went to Dr. Sherril Croon' office and was directly admitted to [facility] and then  transferred to [facility] for further evaluation.  He noted that he went to  Dr. Sherril Croon' office because he had another episode like he did several days  before, but the belching and vomiting did not relieve it.  He was noted  to have Q-waves in lead III and aVF.  Initial CPK-MBs were normal.  The  troponin was 0.3 and 0.4.  His risk factors include history of  hyperlipidemia, history of hypertension, diabetes for several years.  His most recent hemoglobin A1c is reported as 7.2 two months ago.  He is  a remote smoker, but quit 25 years ago.  He has had no previous stroke.  Denies claudication.  Denies renal insufficiency.   PAST SURGERIES:  None.  He  does have a history of lumbar disk disease  treated medically.   FAMILY HISTORY AND SOCIAL HISTORY:  The patient's father's age is 17  status post myocardial infarction.  Mother's age is 3 with no known  coronary disease.  The patient is married, lives in [place], has 3  grandchildren, works for [employer].  Denies alcohol  use.   Current medications include:  1. Lipitor 10 mg day.  2. Lisinopril 2.5 mg a day.  3. Glucovance 5/500 twice a day.  4. Voltaren 75 mg twice a day.   The patient has no known drug allergies.   Review of systems is positive for chest pain, occasional palpitations,  exertional shortness of breath, which is new, and presyncope.  Denies  exertional shortness of breath, orthopnea, or lower extremity edema.  General review of systems, denies fever, chills or night sweats.  Denies  hemoptysis.  Denies change in bowel  habits.  Does have known disk  disease that occasionally causes back pain with pain radiating down into  the legs.  Denies any trouble with urination.  Denies recent infections.   PHYSICAL EXAMINATION:  VITAL SIGNS:  Blood pressure 129/83, pulse is 64,  O2 sat is 96%.  He is 5 feet 5 inches tall, weighs 173 pounds.  HEENT:  He has no carotid bruits.  LUNGS:  Clear bilaterally.  CARDIAC:  Regular rate and rhythm without murmur or gallop.  ABDOMEN:  Benign without palpable masses.   Previous history and physical by separate examiner noted epigastric  bruit.  At the time of exam, I do not appreciate this.  His sheath is in  the right groin, has 2+ DP and PT pulses bilaterally, has adequate vein  for bypass bilaterally.   Laboratory findings reveal creatinine of 0.9.  Hematocrit of 43.4,  hemoglobin of 14.8.   Cardiac catheterization films are reviewed.  The patient has a long  proximal LAD lesion of at least 70%, two small diagonals both 90% or  more, circumflex has 2 small obtuse marginals both with 60% and 70%  lesions.  The  distal circumflex has a 70% lesion, but the very distal  vessel is very small.  The right coronary artery is diffusely diseased  with segmental 60% stenoses and proximal PDS 80%.  Overall, ventricular  function is preserved.  The patient has significant diffuse 3-vessel  coronary artery disease with his diabetic status significant disease,  unstable anginal symptoms.  I agree with recommendation for coronary  artery bypass grafting.  The risks of surgery including death,  infection, stroke, myocardial infarction, bleeding is all discussed with  the patient and his wife in detail.  We will plan to proceed with  surgery this Friday, January 08, 2008.      Sheliah Plane, MD  Electronically Signed     EG/MEDQ  D:  01/06/2008  T:  01/07/2008  Job:  [id number]   cc:   Marca Ancona, MD  Luis Abed, MD, [facility]  Doreen Beam, MD

## 2010-09-04 NOTE — Discharge Summary (Signed)
[name], ARDITO NO.:  [id number]   MEDICAL RECORD NO.:  [id number]          PATIENT TYPE:  INP   LOCATION:  2034                         FACILITY:  [facility]   PHYSICIAN:  Sheliah Plane, MD    DATE OF BIRTH:  Nov 13, 1955   DATE OF ADMISSION:  01/06/2008  DATE OF DISCHARGE:  01/12/2008                               DISCHARGE SUMMARY   PRIMARY ADMITTING DIAGNOSIS:  Chest pain.   ADDITIONAL/DISCHARGE DIAGNOSES:  1. Unstable angina.  2. Hypertension.  3. Type 2 diabetes mellitus.  4. Hyperlipidemia.  5. Coronary artery occlusive disease.  6. History of lumbar disk disease.  7. Remote history of tobacco abuse.  8. 40-60% left internal carotid artery stenosis.   PROCEDURES PERFORMED:  1. Cardiac catheterization.  2. Coronary artery bypass grafting x6 (left internal mammary artery to      the LAD; saphenous vein graft to the second diagonal; sequential      saphenous vein graft to the first diagonal, the first obtuse      marginal, and second obtuse marginal; and saphenous vein graft to      the posterior descending).  3. Endoscopic vein harvest, right lower extremity.   HISTORY:  The patient is a 55 year old male who approximately 3 days  prior to this admission developed severe chest pain associated with  shortness of breath, nausea, and vomiting which occurred with exertion.  He continued to feel poorly and subsequently presented to Dr. Sherril Croon'  office in [place] for further evaluation.  He had previously been seen by  Cardiology in November 2003 for evaluation of chest pain and at that  time underwent an exercise stress echo which was reportedly negative.  He had had no further problems since that time.  Once arriving at Dr.  Sherril Croon' office, an EKG was performed which showed Q-waves in lead III and  aVF with no acute changes.  He was subsequently referred directly to the  emergency department at [facility] for further evaluation.  He  was seen there by  [facility] and was ruled out for myocardial  infarction.  However, based on his symptoms and his past medical  history, it was felt that he should be transferred directly to [facility] to undergo diagnostic coronary angiography and possible  intervention.   HOSPITAL COURSE:  [name] was transferred to [facility] on  the day of this admission.  He was taken to the Cath Lab by Dr. Gaynelle Adu  and underwent catheterization which showed an ejection fraction of 60%  with no wall motion abnormalities.  He was found to have significant  proximal LAD stenosis up to least 70% with severe stenoses in the first  and second diagonals and moderate stenosis in several obtuse marginals.  He was felt to be a poor candidate for percutaneous intervention.  Cardiac Surgery consultation was requested and the patient was seen by  Dr. Sheliah Plane.  Dr. Tyrone Sage reviewed his films and felt that he  would best be served by proceeding with surgical intervention at this  time.  He explained all risks,  benefits, and alternatives of CABG to the  patient and he agreed to proceed.  He remained pain-free and stable  during his preoperative hospital course.  He underwent preoperative  testing with carotid Doppler studies, which showed a right 40-60% ICA  stenosis and no significant stenosis on the left.  Also, his ABIs were  greater than 1.0 bilaterally.  He was taken to the operating room on  January 08, 2008, and underwent CABG x6 as described in detail above.  He tolerated the procedure well and was transferred to the SICU in  stable condition.  He was able to be extubated shortly after surgery.  He was hemodynamically stable and doing well on postop day #1.  At that  time, his hemodynamic monitoring devices and chest tubes were removed.  He was kept in the unit for further observation.  By postop day #2, he  was ready for transfer to the step-down unit.  Overall, his  postoperative  course has been uneventful.  He did have 1 episode of  fever up to 101.2, but had no significant signs of infection on physical  exam.  Also, his white blood cell count was stable at 9.7.  his  temperature has been observed since that time and he has remained  afebrile.  Otherwise, his vital signs have been stable and he has  maintained normal sinus rhythm.  His O2 sats have remained greater than  90% on room air.  He has been mildly volume overloaded and was started  on Lasix, which he is responding well.  He remains approximately 4 kg  above his preoperative weight, but is diuresing well.  Chest x-rays  remained stable with small left effusion and bibasilar atelectasis.  His  incisions are all healing well.  He is ambulating in the halls with  cardiac rehab phase #1 and he is making good progress.  He is tolerating  a regular diet and is having normal bowel and bladder function.  His  home diabetes medications have been restarted and his blood sugars have  remained stable.  His most recent labs on postop day #3 show hemoglobin  of 10.3, hematocrit 29.9, white count 9.7, platelets 220, sodium 134,  potassium 4.2, BUN 15, and creatinine 1.08.  It is felt that if he  continues to remain stable over the next 24 hours, he will hopefully be  ready for discharge home on January 12, 2008.   Discharge medications are as follows:  1. Lisinopril 2.5 mg daily.  2. Glucovance 5/500 two tabs b.i.d.  3. Avandia 2 mg daily.  4. Allegra p.r.n.  5. Aspirin 325 mg daily.  6. Lopressor 25 mg b.i.d.  7. Lipitor 20 mg nightly.  8. Lasix 40 mg daily x5 days.  9. Potassium 20 mEq daily x5 days.  10.Oxycodone 5 mg 1-2 q.4 h. p.r.n. for pain.   DISCHARGE INSTRUCTIONS:  He is asked to refrain from driving, heavy  lifting, or strenuous activity.  He may continue ambulating daily and  using his incentive spirometer.  He may shower daily and clean his  incisions with soap and water.  He will continue a  low-fat, low-sodium  diet.   DISCHARGE FOLLOWUP:  He will see Dr. Andee Lineman in the [facility] office  in 2 weeks.  He will then follow up with Dr. Tyrone Sage on February 01, 2008, with a chest x-ray from [facility].  If he experiences any  problems or has questions in the interim, he is asked  to contact our  office immediately.      Coral Ceo, P.A.      Sheliah Plane, MD  Electronically Signed    GC/MEDQ  D:  01/11/2008  T:  01/12/2008  Job:  [id number]   cc:   Doreen Beam, MD  Learta Codding, MD,FACC

## 2010-09-04 NOTE — Assessment & Plan Note (Signed)
[facility]                          [facility]ICE NOTE   NAME:[name], [name]                       MRN:          [id number]  DATE:02/04/2008                            DOB:          29-Aug-1955    PRIMARY CARE PHYSICIAN:  Doreen Beam, MD   REASON FOR VISIT:  Postsurgical followup.   HISTORY OF PRESENT ILLNESS:  [name] was seen most recently in  hospital by Dr. Andee Lineman having presented with unstable angina and normal  cardiac markers.  His risk factor profile included type 2 diabetes  mellitus, hyperlipidemia and hypertension, and he was ultimately  transferred for diagnostic cardiac catheterization at [facility].  This procedure was performed on January 06, 2008, and  revealed an ejection fraction of 60% with no wall motion abnormalities,  60% mid right coronary artery stenosis, 70% ostial left anterior  descending stenosis, 99% first diagonal stenosis, 90% small second  diagonal stenosis, 40-50% distal left anterior descending stenosis, 60%  first obtuse marginal stenosis, 60% second obtuse marginal stenosis, and  70% small third obtuse marginal stenosis with subtotally occlusion of  the distal circumflex.  He was referred for coronary artery bypass  grafting including LIMA to left anterior descending, saphenous vein  graft to second diagonal, sequential saphenous vein graft to first  diagonal, first obtuse marginal and second obtuse marginal and saphenous  vein graft to posterior descending.  Postsurgical course was  uncomplicated and he is progressing very well at this point.  He just  recently saw Dr. Tyrone Sage on February 01, 2008, and it was felt that he  could return to light duty.  He is having no significant postsurgical  chest pain and his medical regimen is outlined below.  He was seen  briefly at [facility] with nausea and emesis while on Lasix and  potassium and recuperated quite well with discontinuation of  these  medicines.  He was previously working part-time at the [employer] here in [place] and he is interest in returning.  We  talked about weight restrictions and he was comfortable with this.  A  note was provided for him to take back to work.  This is copied in the  chart.   ALLERGIES:  General intolerance to POTASSIUM SUPPLEMENTS.   PRESENT MEDICATIONS:  1. Lipitor 20 mg p.o. nightly.  2. Lisinopril 2.5 mg p.o. daily.  3. Glucovance 5/500 mg 2 tablets p.o. b.i.d.  4. Avandia 2 mg p.o. daily.  5. Enteric-coated aspirin 325 mg p.o. daily.  6. Lopressor 25 mg p.o. b.i.d.  7. Allegra p.r.n.   REVIEW OF SYSTEMS:  As described in the history of present illness.  Otherwise negative.   PHYSICAL EXAMINATION:  VITAL SIGNS:  Blood pressure 109/67, heart rate  is 65, weight 168 pounds.  GENERAL:  He is a well-nourished male, in no acute distress.  HEENT:  Conjunctiva is normal.  Oropharynx clear.  NECK:  Supple.  No elevated jugular venous pressure.  No loud bruits.  LUNGS:  Clear on breathing at rest.  The sternal incision site is  healing quite well.  CHEST:  Stable.  No erythema or drainage.  CARDIAC:  Regular rate and rhythm.  No significant murmur or rub.  EXTREMITIES:  No significant pitting edema.  Vein harvest sites are  stable.   IMPRESSION AND RECOMMENDATIONS:  Multivessel artery coronary disease  with preserved ejection fraction status post coronary bypass grafting in  September as outlined above.  [name] is recuperating very well at  this point.  He is on a reasonable medical regiment and blood pressure  and heart rate well controlled.  I think he should be able to return to  light duty at the [employer] here in [place].  We  outlined some lifting restrictions in a note provided to him.  We will  plan to see him back over the next 3 months and followup with the lipid  profile at that time to assess LDL control on Lipitor.   Otherwise, he  will continue to follow up with Dr. Sherril Croon.     Jonelle Sidle, MD  Electronically Signed    SGM/MedQ  DD: 02/04/2008  DT: 02/04/2008  Job #: [id number]   cc:   Doreen Beam, MD

## 2010-09-28 ENCOUNTER — Encounter: Payer: Self-pay | Admitting: Cardiology

## 2010-09-28 ENCOUNTER — Ambulatory Visit (INDEPENDENT_AMBULATORY_CARE_PROVIDER_SITE_OTHER): Payer: PRIVATE HEALTH INSURANCE | Admitting: Cardiology

## 2010-09-28 VITALS — BP 111/68 | HR 78 | Resp 18 | Ht 65.0 in | Wt 173.4 lb

## 2010-09-28 DIAGNOSIS — E785 Hyperlipidemia, unspecified: Secondary | ICD-10-CM

## 2010-09-28 DIAGNOSIS — I10 Essential (primary) hypertension: Secondary | ICD-10-CM

## 2010-09-28 DIAGNOSIS — I2510 Atherosclerotic heart disease of native coronary artery without angina pectoris: Secondary | ICD-10-CM

## 2010-09-28 NOTE — Assessment & Plan Note (Signed)
Doing well, symptomatically stable on medical therapy. Plan to go to an annual visit. Otherwise continue regular followup with Dr. Sherril Croon.

## 2010-09-28 NOTE — Assessment & Plan Note (Signed)
Blood pressure well controlled today. No changes made. 

## 2010-09-28 NOTE — Progress Notes (Signed)
Clinical Summary [name] is a 55 y.o.male presenting for followup. He was seen in December 2011. He is here with his wife.  Followup lab work from May 25 shows AST 17, ALT 28, cholesterol 123, HDL 32, LDL 74, triglycerides 86. We discussed this today. He is tolerating Lipitor well.  He reports no significant angina or shortness of breath with regular activities. Continues to work full-time. He has not been walking as regularly and we discussed exercise.  Still watching his diet, has not gained any significant weight.   Allergies  Allergen Reactions  . Furosemide     REACTION: unspecified    Current outpatient prescriptions:aspirin 81 MG tablet, Take 81 mg by mouth daily.  , Disp: , Rfl: ;  atorvastatin (LIPITOR) 20 MG tablet, Take 20 mg by mouth daily.  , Disp: , Rfl: ;  cetirizine (ZYRTEC) 10 MG tablet, Take 10 mg by mouth daily.  , Disp: , Rfl: ;  glyBURIDE-metformin (GLUCOVANCE) 5-500 MG per tablet, Take two by mouth twice daily  , Disp: , Rfl: ;  Liraglutide (VICTOZA [place]), Inject 0.6 mg into the skin daily at 8 pm.  , Disp: , Rfl:  lisinopril (PRINIVIL,ZESTRIL) 5 MG tablet, Take 5 mg by mouth daily.  , Disp: , Rfl: ;  metoprolol tartrate (LOPRESSOR) 25 MG tablet, Take 1 tablet (25 mg total) by mouth 2 (two) times daily., Disp: 60 tablet, Rfl: 6;  pantoprazole (PROTONIX) 40 MG tablet, Take 40 mg by mouth daily.  , Disp: , Rfl: ;  sitaGLIPtan (JANUVIA) 50 MG tablet, Take 50 mg by mouth daily.  , Disp: , Rfl:  fexofenadine (ALLEGRA) 180 MG tablet, Take 180 mg by mouth daily.  , Disp: , Rfl: ;  insulin aspart (NOVOLOG FLEXPEN) 100 UNIT/ML injection, Use as directed  , Disp: , Rfl: ;  Omega-3 Fatty Acids (FISH OIL) 1000 MG CAPS, Take one by mouth three times daily  , Disp: , Rfl:   Past Medical History  Diagnosis Date  . Coronary atherosclerosis of native coronary artery     Multivessel, LVEF 60%  . Type 2 diabetes mellitus   . Essential hypertension, benign   . Hyperlipidemia     Past  Surgical History  Procedure Date  . Coronary artery bypass graft 09/09    LIMA to LAD, SVG to D1/OM1/OM2, SVG to D2, SVG to PDA    Family History  Problem Relation Age of Onset  . Coronary artery disease      Social History [name] reports that he quit smoking about 27 years ago. His smoking use included Cigarettes. He has never used smokeless tobacco. [name] reports that he does not drink alcohol.  Review of Systems No palpitations, melena, hematochezia. Stable appetite. No claudication. Otherwise negative.  Physical Examination Filed Vitals:   09/28/10 1543  BP: 111/68  Pulse: 78  Resp: 18   Additional Exam: Normally nourished appearing male in no acute distress.  HEENT: Conjunctiva and lids normal, oropharynx with moist mucosa.  Neck: Supple, no elevated JVP.  Cardiac: Regular rate and rhythm, no S3.  Thorax: Stable sternum post sternotomy.  Abdomen: Soft, nontender, bowel sounds present.  Skin: Warm and dry.  Extremities: No pitting edema.  Musculoskeletal: No gross deformities.  Neuropsychiatric: Alert and oriented x3, affect appropriate.   ECG Normal sinus rhythm at 78.   Problem List and Plan

## 2010-09-28 NOTE — Assessment & Plan Note (Signed)
Lipids look quite good, liver function tests normal. Continue same therapy.

## 2010-09-28 NOTE — Patient Instructions (Addendum)
Your physician you to follow up in 1 year. You will receive a reminder letter in the mail one-two months in advance. If you don't receive a letter, please call our office to schedule the follow-up appointment. Your physician recommends that you continue on your current medications as directed. Please refer to the Current Medication list given to you today. 

## 2010-12-12 ENCOUNTER — Other Ambulatory Visit: Payer: Self-pay | Admitting: Cardiology

## 2011-01-21 LAB — CBC
HCT: 29.3 — ABNORMAL LOW
HCT: 29.4 — ABNORMAL LOW
HCT: 29.9 — ABNORMAL LOW
HCT: 30.4 — ABNORMAL LOW
HCT: 30.9 — ABNORMAL LOW
HCT: 33.1 — ABNORMAL LOW
HCT: 41.8
Hemoglobin: 10 — ABNORMAL LOW
Hemoglobin: 10.1 — ABNORMAL LOW
Hemoglobin: 10.3 — ABNORMAL LOW
Hemoglobin: 10.3 — ABNORMAL LOW
Hemoglobin: 10.7 — ABNORMAL LOW
Hemoglobin: 11.1 — ABNORMAL LOW
Hemoglobin: 14.1
MCHC: 33.7
MCHC: 33.9
MCHC: 34.1
MCHC: 34.2
MCHC: 34.3
MCHC: 34.4
MCHC: 34.7
MCV: 89.5
MCV: 90.3
MCV: 90.8
MCV: 91.4
MCV: 91.5
MCV: 91.6
MCV: 91.7
MCV: 92.1
Platelets: 179
Platelets: 182
Platelets: 197
Platelets: 198
Platelets: 201
Platelets: 211
Platelets: 220
Platelets: 245
RBC: 3.2 — ABNORMAL LOW
RBC: 3.26 — ABNORMAL LOW
RBC: 3.26 — ABNORMAL LOW
RBC: 3.3 — ABNORMAL LOW
RBC: 3.45 — ABNORMAL LOW
RBC: 3.61 — ABNORMAL LOW
RBC: 4 — ABNORMAL LOW
RBC: 4.56
RDW: 12.4
RDW: 12.4
RDW: 12.4
RDW: 12.4
RDW: 12.7
RDW: 12.7
RDW: 12.7
RDW: 12.8
WBC: 10.5
WBC: 10.9 — ABNORMAL HIGH
WBC: 11.6 — ABNORMAL HIGH
WBC: 11.8 — ABNORMAL HIGH
WBC: 12.2 — ABNORMAL HIGH
WBC: 7.1
WBC: 9.7

## 2011-01-21 LAB — CARDIAC PANEL(CRET KIN+CKTOT+MB+TROPI)
CK, MB: 8.8 — ABNORMAL HIGH
Relative Index: 2
Total CK: 440 — ABNORMAL HIGH
Troponin I: 1.08

## 2011-01-21 LAB — BASIC METABOLIC PANEL
BUN: 11
BUN: 13
BUN: 14
BUN: 15
BUN: 15
BUN: 18
BUN: 25 — ABNORMAL HIGH
CO2: 25
CO2: 25
CO2: 26
CO2: 26
CO2: 30
Calcium: 7.4 — ABNORMAL LOW
Calcium: 7.7 — ABNORMAL LOW
Calcium: 8 — ABNORMAL LOW
Calcium: 8.4
Calcium: 8.5
Chloride: 100
Chloride: 101
Chloride: 105
Chloride: 107
Chloride: 96
Chloride: 97
Creatinine, Ser: 0.85
Creatinine, Ser: 0.93
Creatinine, Ser: 1
Creatinine, Ser: 1.04
Creatinine, Ser: 1.08
Creatinine, Ser: 4.2 — ABNORMAL HIGH
GFR calc Af Amer: 18 — ABNORMAL LOW
GFR calc Af Amer: >60
GFR calc Af Amer: >60
GFR calc Af Amer: >60
GFR calc non Af Amer: 15 — ABNORMAL LOW
GFR calc non Af Amer: >60
GFR calc non Af Amer: >60
GFR calc non Af Amer: >60
GFR calc non Af Amer: >60
GFR calc non Af Amer: >60
Glucose, Bld: 114 — ABNORMAL HIGH
Glucose, Bld: 119 — ABNORMAL HIGH
Glucose, Bld: 180 — ABNORMAL HIGH
Glucose, Bld: 195 — ABNORMAL HIGH
Glucose, Bld: 210 — ABNORMAL HIGH
Glucose, Bld: 268 — ABNORMAL HIGH
Potassium: 3.8
Potassium: 4
Potassium: 4
Potassium: 4
Potassium: 4.2
Sodium: 133 — ABNORMAL LOW
Sodium: 134 — ABNORMAL LOW
Sodium: 135
Sodium: 136
Sodium: 136
Sodium: 138

## 2011-01-21 LAB — APTT
aPTT: 29
aPTT: 34

## 2011-01-21 LAB — POCT I-STAT, CHEM 8
Calcium, Ion: 1.08 — ABNORMAL LOW
Chloride: 105
Creatinine, Ser: 1
Creatinine, Ser: 1.1
Glucose, Bld: 205 — ABNORMAL HIGH
HCT: 29 — ABNORMAL LOW
HCT: 29 — ABNORMAL LOW
HCT: 31 — ABNORMAL LOW
Hemoglobin: 10.5 — ABNORMAL LOW
Hemoglobin: 9.9 — ABNORMAL LOW
Hemoglobin: 9.9 — ABNORMAL LOW
Potassium: 3.5
Potassium: 4.3
Sodium: 137
Sodium: 140
TCO2: 24

## 2011-01-21 LAB — PLATELET COUNT: Platelets: 165

## 2011-01-21 LAB — POCT I-STAT 3, VENOUS BLOOD GAS (G3P V)
O2 Saturation: 83
Patient temperature: 30.5

## 2011-01-21 LAB — POCT I-STAT 3, ART BLOOD GAS (G3+)
Acid-Base Excess: 3 — ABNORMAL HIGH
Bicarbonate: 26.7 — ABNORMAL HIGH
O2 Saturation: 100
Patient temperature: 37.1
Patient temperature: 37.8
TCO2: 24
TCO2: 28
pCO2 arterial: 28.6 — ABNORMAL LOW
pCO2 arterial: 35
pH, Arterial: 7.341 — ABNORMAL LOW
pH, Arterial: 7.432
pH, Arterial: 7.482 — ABNORMAL HIGH
pH, Arterial: 7.533 — ABNORMAL HIGH
pO2, Arterial: 255 — ABNORMAL HIGH

## 2011-01-21 LAB — LIPID PANEL
Cholesterol: 187
HDL: 23 — ABNORMAL LOW
LDL Cholesterol: 123 — ABNORMAL HIGH
Total CHOL/HDL Ratio: 8.1

## 2011-01-21 LAB — GLUCOSE, CAPILLARY
Glucose-Capillary: 119 — ABNORMAL HIGH
Glucose-Capillary: 153 — ABNORMAL HIGH
Glucose-Capillary: 158 — ABNORMAL HIGH
Glucose-Capillary: 163 — ABNORMAL HIGH
Glucose-Capillary: 176 — ABNORMAL HIGH
Glucose-Capillary: 189 — ABNORMAL HIGH
Glucose-Capillary: 218 — ABNORMAL HIGH
Glucose-Capillary: 225 — ABNORMAL HIGH
Glucose-Capillary: 232 — ABNORMAL HIGH
Glucose-Capillary: 273 — ABNORMAL HIGH
Glucose-Capillary: 73
Glucose-Capillary: 94

## 2011-01-21 LAB — BLOOD GAS, ARTERIAL
Drawn by: 308601
pCO2 arterial: 42.9
pH, Arterial: 7.392

## 2011-01-21 LAB — POCT I-STAT 4, (NA,K, GLUC, HGB,HCT)
Glucose, Bld: 268 — ABNORMAL HIGH
Glucose, Bld: 95
HCT: 24 — ABNORMAL LOW
HCT: 26 — ABNORMAL LOW
HCT: 37 — ABNORMAL LOW
Hemoglobin: 12.6 — ABNORMAL LOW
Hemoglobin: 8.2 — ABNORMAL LOW
Hemoglobin: 8.8 — ABNORMAL LOW
Potassium: 4.6
Potassium: 4.7
Potassium: 6.1 — ABNORMAL HIGH
Sodium: 133 — ABNORMAL LOW
Sodium: 135

## 2011-01-21 LAB — CREATININE, SERUM
Creatinine, Ser: 0.89
Creatinine, Ser: 0.98
GFR calc Af Amer: >60
GFR calc Af Amer: >60
GFR calc non Af Amer: >60
GFR calc non Af Amer: >60

## 2011-01-21 LAB — BLEEDING TIME: Bleeding Time: 6.5

## 2011-01-21 LAB — MAGNESIUM
Magnesium: 2.4
Magnesium: 2.4
Magnesium: 2.7 — ABNORMAL HIGH

## 2011-01-21 LAB — COMPREHENSIVE METABOLIC PANEL
Alkaline Phosphatase: 46
BUN: 13
CO2: 24
Calcium: 8.5
GFR calc non Af Amer: >60
Glucose, Bld: 179 — ABNORMAL HIGH
Potassium: 4.4
Total Protein: 5.9 — ABNORMAL LOW

## 2011-01-21 LAB — POCT I-STAT GLUCOSE
Glucose, Bld: 221 — ABNORMAL HIGH
Operator id: 125961

## 2011-01-21 LAB — ABO/RH: ABO/RH(D): O NEG

## 2011-01-21 LAB — PROTIME-INR
INR: 1.3
Prothrombin Time: 16.8 — ABNORMAL HIGH

## 2011-01-21 LAB — TYPE AND SCREEN: ABO/RH(D): O NEG

## 2011-01-21 LAB — HEMOGLOBIN AND HEMATOCRIT, BLOOD: Hemoglobin: 8.7 — ABNORMAL LOW

## 2011-02-08 ENCOUNTER — Other Ambulatory Visit: Payer: Self-pay | Admitting: *Deleted

## 2011-02-08 DIAGNOSIS — I2510 Atherosclerotic heart disease of native coronary artery without angina pectoris: Secondary | ICD-10-CM

## 2011-02-08 MED ORDER — METOPROLOL TARTRATE 25 MG PO TABS: 25.0000 mg | ORAL_TABLET | Freq: Two times a day (BID) | ORAL | Status: DC

## 2011-07-12 ENCOUNTER — Other Ambulatory Visit: Payer: Self-pay | Admitting: Cardiology

## 2011-09-06 ENCOUNTER — Other Ambulatory Visit: Payer: Self-pay | Admitting: Cardiology

## 2011-12-25 ENCOUNTER — Ambulatory Visit: Payer: PRIVATE HEALTH INSURANCE | Admitting: Cardiology

## 2012-02-07 ENCOUNTER — Other Ambulatory Visit: Payer: Self-pay | Admitting: Cardiology

## 2012-02-11 ENCOUNTER — Ambulatory Visit (INDEPENDENT_AMBULATORY_CARE_PROVIDER_SITE_OTHER): Payer: Commercial Indemnity | Admitting: Cardiology

## 2012-02-11 ENCOUNTER — Encounter: Payer: Self-pay | Admitting: Cardiology

## 2012-02-11 VITALS — BP 119/76 | HR 67 | Ht 65.0 in | Wt 168.8 lb

## 2012-02-11 DIAGNOSIS — I2510 Atherosclerotic heart disease of native coronary artery without angina pectoris: Secondary | ICD-10-CM

## 2012-02-11 DIAGNOSIS — I10 Essential (primary) hypertension: Secondary | ICD-10-CM

## 2012-02-11 DIAGNOSIS — E785 Hyperlipidemia, unspecified: Secondary | ICD-10-CM

## 2012-02-11 NOTE — Assessment & Plan Note (Signed)
Blood pressure is normal today. 

## 2012-02-11 NOTE — Patient Instructions (Addendum)

## 2012-02-11 NOTE — Assessment & Plan Note (Signed)
Keep followup with Dr. Sherril Croon, Goal LDL should be around 70.

## 2012-02-11 NOTE — Progress Notes (Signed)
   Clinical Summary [name] is a 56 y.o.male presenting for followup. He was last seen in June of 2012. He states that he has been doing very well, continues to work full time, has had no angina or unusual breathlessness. He reports compliance with his medications. Lipids has been followed by Dr. Sherril Church.  ECG reviewed today and normal.   Allergies  Allergen Reactions  . Furosemide     REACTION: unspecified    Current Outpatient Prescriptions  Medication Sig Dispense Refill  . aspirin 81 MG tablet Take 81 mg by mouth daily.        Marland Kitchen atorvastatin (LIPITOR) 20 MG tablet       . cetirizine (ZYRTEC) 10 MG tablet Take 10 mg by mouth daily as needed.       . glyBURIDE-metformin (GLUCOVANCE) 5-500 MG per tablet Take two by mouth twice daily        . Liraglutide (VICTOZA [place]) Inject 1.8 mLs into the skin every morning.       Marland Kitchen lisinopril (PRINIVIL,ZESTRIL) 5 MG tablet Take 2.5 mg by mouth daily.       . metoprolol tartrate (LOPRESSOR) 25 MG tablet TAKE 1 TABLET TWICE DAILY.  60 tablet  6  . Omega-3 Fatty Acids (FISH OIL) 1000 MG CAPS Take 1 capsule by mouth 2 (two) times daily.       . pantoprazole (PROTONIX) 40 MG tablet Take 40 mg by mouth daily.        Marland Kitchen DISCONTD: atorvastatin (LIPITOR) 20 MG tablet TAKE 1 TABLET ONCE DAILY  30 tablet  0    Past Medical History  Diagnosis Date  . Coronary atherosclerosis of native coronary artery     Multivessel, LVEF 60%  . Type 2 diabetes mellitus   . Essential hypertension, benign   . Hyperlipidemia     Past Surgical History  Procedure Date  . Coronary artery bypass graft 09/09    LIMA to LAD, SVG to D1/OM1/OM2, SVG to D2, SVG to PDA    Social History [name] reports that he quit smoking about 28 years ago. His smoking use included Cigarettes. He has never used smokeless tobacco. [name] reports that he does not drink alcohol.  Review of Systems No palpitations, bleeding problems, orthopnea or PND. Stable appetite. No claudication.  Otherwise negative.  Physical Examination Filed Vitals:   02/11/12 1451  BP: 119/76  Pulse: 67   Filed Weights   02/11/12 1451  Weight: 168 lb 12.8 oz (76.567 kg)   HEENT: Conjunctiva and lids normal, oropharynx with moist mucosa.  Neck: Supple, no elevated JVP.  Cardiac: Regular rate and rhythm, no S3.  Thorax: Stable sternum post sternotomy.  Abdomen: Soft, nontender, bowel sounds present.  Skin: Warm and dry.  Extremities: No pitting edema.  Musculoskeletal: No gross deformities.  Neuropsychiatric: Alert and oriented x3, affect appropriate.   Problem List and Plan   CORONARY ATHEROSCLEROSIS NATIVE CORONARY ARTERY Symptomatically stable on medical therapy. ECG is normal. No changes made. Continue observation.  HYPERLIPIDEMIA-MIXED Keep followup with Dr. Sherril Church, Goal LDL should be around 70.  HYPERTENSION, UNSPECIFIED Blood pressure is normal today.    [name], M.D., F.A.C.C.

## 2012-02-11 NOTE — Assessment & Plan Note (Signed)
Symptomatically stable on medical therapy. ECG is normal. No changes made. Continue observation.

## 2012-03-05 ENCOUNTER — Other Ambulatory Visit: Payer: Self-pay | Admitting: Cardiology

## 2012-04-10 ENCOUNTER — Other Ambulatory Visit: Payer: Self-pay | Admitting: Cardiology

## 2012-10-01 ENCOUNTER — Other Ambulatory Visit: Payer: Self-pay | Admitting: Cardiology

## 2013-03-26 ENCOUNTER — Other Ambulatory Visit: Payer: Self-pay | Admitting: Cardiology

## 2013-04-05 ENCOUNTER — Encounter: Payer: Self-pay | Admitting: Cardiology

## 2013-04-05 ENCOUNTER — Ambulatory Visit (INDEPENDENT_AMBULATORY_CARE_PROVIDER_SITE_OTHER): Payer: PRIVATE HEALTH INSURANCE | Admitting: Cardiology

## 2013-04-05 VITALS — BP 116/67 | HR 58 | Ht 65.0 in | Wt 165.1 lb

## 2013-04-05 DIAGNOSIS — I10 Essential (primary) hypertension: Secondary | ICD-10-CM

## 2013-04-05 DIAGNOSIS — E785 Hyperlipidemia, unspecified: Secondary | ICD-10-CM

## 2013-04-05 DIAGNOSIS — I2510 Atherosclerotic heart disease of native coronary artery without angina pectoris: Secondary | ICD-10-CM

## 2013-04-05 NOTE — Patient Instructions (Signed)

## 2013-04-05 NOTE — Progress Notes (Signed)
Clinical Summary [name] is a 57 y.o.male last seen in October 2013. He is here with his wife, continues to do very well. He is working for the city of [employer], now in Production designer, theatre/television/film. He reports no exertional symptoms, also walks at the track for 3 miles, feels well.  He reports compliance with his medications. He has had followup lab work and pending physical with Dr. Sherril Croon. No change in lipid regimen.  ECG today shows sinus bradycardia.   Allergies  Allergen Reactions  . Furosemide     REACTION: unspecified    Current Outpatient Prescriptions  Medication Sig Dispense Refill  . aspirin 81 MG tablet Take 81 mg by mouth daily.        Marland Kitchen atorvastatin (LIPITOR) 20 MG tablet Take 20 mg by mouth every evening.       . Canagliflozin (INVOKANA) 300 MG TABS Take 1 tablet by mouth daily.      . cetirizine (ZYRTEC) 10 MG tablet Take 10 mg by mouth daily as needed.       . glyBURIDE-metformin (GLUCOVANCE) 5-500 MG per tablet Take two by mouth twice daily        . lisinopril (PRINIVIL,ZESTRIL) 5 MG tablet Take 2.5 mg by mouth daily.       . metoprolol tartrate (LOPRESSOR) 25 MG tablet TAKE ONE TABLET BY MOUTH TWICE DAILY  60 tablet  3  . Omega-3 Fatty Acids (FISH OIL) 1000 MG CAPS Take 1 capsule by mouth 2 (two) times daily.       . pantoprazole (PROTONIX) 40 MG tablet Take 40 mg by mouth daily.         No current facility-administered medications for this visit.    Past Medical History  Diagnosis Date  . Coronary atherosclerosis of native coronary artery     Multivessel, LVEF 60%  . Type 2 diabetes mellitus   . Essential hypertension, benign   . Hyperlipidemia     Past Surgical History  Procedure Laterality Date  . Coronary artery bypass graft  09/09    LIMA to LAD, SVG to D1/OM1/OM2, SVG to D2, SVG to PDA    Social History [name] reports that he quit smoking about 29 years ago. His smoking use included Cigarettes. He smoked 0.00 packs per day. He has never  used smokeless tobacco. [name] reports that he does not drink alcohol.  Review of Systems No claudication, no palpitations, dizziness, syncope. Arthritic knee pain. Otherwise negative.  Physical Examination Filed Vitals:   04/05/13 0840  BP: 116/67  Pulse: 58   Filed Weights   04/05/13 0840  Weight: 165 lb 1.9 oz (74.898 kg)   Appears comfortable at rest. HEENT: Conjunctiva and lids normal, oropharynx with moist mucosa.  Neck: Supple, no elevated JVP.  Cardiac: Regular rate and rhythm, no S3.  Thorax: Stable sternum post sternotomy.  Abdomen: Soft, nontender, bowel sounds present.  Skin: Warm and dry.  Extremities: No pitting edema.  Musculoskeletal: No gross deformities.  Neuropsychiatric: Alert and oriented x3, affect appropriate.   Problem List and Plan   CORONARY ATHEROSCLEROSIS NATIVE CORONARY ARTERY Symptomatically stable, ECG normal. He remains on good medical regimen, has been exercising regularly without symptoms. We have discussed eventually a followup surveillance stress test, although no clear indication at this time. Followup in one year, sooner if symptoms intervene.  Essential hypertension, benign Blood pressure is normal today.  HYPERLIPIDEMIA-MIXED  Continues on Lipitor, omega-3 supplements. Keep followup with Dr. Sherril Croon.  Satira Sark, M.D., F.A.C.C.

## 2013-04-05 NOTE — Assessment & Plan Note (Signed)
Blood pressure is normal today. 

## 2013-04-05 NOTE — Assessment & Plan Note (Signed)
Continues on Lipitor, omega-3 supplements. Keep followup with Dr. Sherril Croon.

## 2013-04-05 NOTE — Assessment & Plan Note (Signed)
Symptomatically stable, ECG normal. He remains on good medical regimen, has been exercising regularly without symptoms. We have discussed eventually a followup surveillance stress test, although no clear indication at this time. Followup in one year, sooner if symptoms intervene.

## 2013-05-06 ENCOUNTER — Other Ambulatory Visit: Payer: Self-pay | Admitting: Cardiology

## 2013-07-27 ENCOUNTER — Other Ambulatory Visit: Payer: Self-pay | Admitting: Cardiology

## 2014-02-25 ENCOUNTER — Other Ambulatory Visit: Payer: Self-pay | Admitting: Cardiology

## 2014-06-14 ENCOUNTER — Ambulatory Visit (INDEPENDENT_AMBULATORY_CARE_PROVIDER_SITE_OTHER): Payer: PRIVATE HEALTH INSURANCE | Admitting: Cardiology

## 2014-06-14 ENCOUNTER — Encounter: Payer: Self-pay | Admitting: *Deleted

## 2014-06-14 ENCOUNTER — Telehealth: Payer: Self-pay | Admitting: Cardiology

## 2014-06-14 ENCOUNTER — Encounter: Payer: Self-pay | Admitting: Cardiology

## 2014-06-14 VITALS — BP 138/71 | HR 61 | Ht 65.0 in | Wt 172.0 lb

## 2014-06-14 DIAGNOSIS — E782 Mixed hyperlipidemia: Secondary | ICD-10-CM

## 2014-06-14 DIAGNOSIS — I10 Essential (primary) hypertension: Secondary | ICD-10-CM

## 2014-06-14 DIAGNOSIS — E119 Type 2 diabetes mellitus without complications: Secondary | ICD-10-CM

## 2014-06-14 DIAGNOSIS — I2510 Atherosclerotic heart disease of native coronary artery without angina pectoris: Secondary | ICD-10-CM

## 2014-06-14 NOTE — Telephone Encounter (Signed)
Exercise Cardiolite holding lopressor pm and am dosing dx:CAD & S/P CABG Schedule March 7 register at 9:30

## 2014-06-14 NOTE — Progress Notes (Signed)
Cardiology Office Note  Date: 06/14/2014   ID: [name], DOB 10/21/1955, MRN [id number]  PCP: [name],Kyle B., [name]  Primary Cardiologist: Kyle DellSamuel Antonio Woodhams, [name]   Chief Complaint  Patient presents with  . Coronary Artery Disease  . Hyperlipidemia    History of Present Illness: [name] is a 59 y.o. male last seen in December 2014. He is here with his wife today for routine follow-up visit. Reports no angina symptoms or limiting shortness of breath. 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  We reviewed his medications, no major changes noted. Lipids were obtained by Dr. Sherril CroonVyas in the interim, outlined below. He reports no intolerances to Lipitor.  We have discussed follow-up surveillance stress testing, status post CABG in 2009. He is in agreement to proceed at this time.   Past Medical History  Diagnosis Date  . Coronary atherosclerosis of native coronary artery     Multivessel, LVEF 60%  . Type 2 diabetes mellitus   . Essential hypertension, benign   . Hyperlipidemia     Past Surgical History  Procedure Laterality Date  . Coronary artery bypass graft  09/09    LIMA to LAD, SVG to D1/OM1/OM2, SVG to D2, SVG to PDA    Current Outpatient Prescriptions  Medication Sig Dispense Refill  . aspirin 81 MG tablet Take 81 mg by mouth daily.      Marland Kitchen. atorvastatin (LIPITOR) 20 MG tablet Take 20 mg by mouth every evening.     . Canagliflozin (INVOKANA) 300 MG TABS Take 1 tablet by mouth daily.    . cetirizine (ZYRTEC) 10 MG tablet Take 10 mg by mouth daily as needed.     . glyBURIDE-metformin (GLUCOVANCE) 5-500 MG per tablet Take two by mouth twice daily      . lisinopril (PRINIVIL,ZESTRIL) 5 MG tablet Take 2.5 mg by mouth daily.     . metoprolol tartrate (LOPRESSOR) 25 MG tablet TAKE ONE TABLET BY MOUTH TWICE DAILY 60 tablet 6  . Omega-3 Fatty Acids (FISH OIL) 1000 MG CAPS Take 1 capsule by mouth 2 (two) times daily.     . pantoprazole (PROTONIX)  40 MG tablet Take 40 mg by mouth daily.       No current facility-administered medications for this visit.    Allergies:  Furosemide   Social History: The patient  reports that he quit smoking about 31 years ago. His smoking use included Cigarettes. He has never used smokeless tobacco. He reports that he does not drink alcohol or use illicit drugs.   ROS:  Please see the history of present illness. Otherwise, complete review of systems is positive for none.  All other systems are reviewed and negative.    Physical Exam: VS:  BP 138/71 mmHg  Pulse 61  Ht 5\' 5"  (1.651 m)  Wt 172 lb (78.019 kg)  BMI 28.62 kg/m2  SpO2 99%, BMI Body mass index is 28.62 kg/(m^2).  Wt Readings from Last 3 Encounters:  06/14/14 172 lb (78.019 kg)  04/05/13 165 lb 1.9 oz (74.898 kg)  02/11/12 168 lb 12.8 oz (76.567 kg)     Appears comfortable at rest. HEENT: Conjunctiva and lids normal, oropharynx with moist mucosa.  Neck: Supple, no elevated JVP.  Cardiac: Regular rate and rhythm, no S3.  Thorax: Stable sternum post sternotomy.  Abdomen: Soft, nontender, bowel sounds present.  Skin: Warm and dry.  Extremities: No pitting edema.  Musculoskeletal: No gross deformities.  Neuropsychiatric: Alert and  oriented x3, affect appropriate.   ECG: ECG is ordered today and reviewed showing sinus rhythm with borderline decreased voltage in the limb leads.   Recent Labwork:  Cholesterol 167, triglycerides 99, HDL 41, LDL 97.  Assessment and Plan:  1. Multivessel CAD status post CABG in 2009. ECG reviewed and stable. He has done well on medical therapy, is due for follow-up ischemic testing to reassess ischemic burden. We will proceed with an exercise Cardiolite, hold Lopressor for the test. If low risk, continue annual follow-up.  2. Hyperlipidemia, on statin therapy per Dr. Sherril Croon.  3. Essential hypertension, no change to current regimen.  4. Type 2 diabetes mellitus, followed by Dr.  Sherril Croon.  Current medicines are reviewed at length with the patient today.  The patient does not have concerns regarding medicines.   Orders Placed This Encounter  Procedures  . NM Myocar Multi W/Spect W/Wall Motion / EF  . Myocardial Perfusion Imaging  . EKG 12-Lead    Disposition: FU with me in 1 year.   Signed, Jonelle Sidle, [name], [facility] 06/14/2014 4:17 PM    [facility] at [facility] [address] [place], [place], [place] [address] Phone: [phone]; Fax: [phone]

## 2014-06-14 NOTE — Patient Instructions (Signed)
Your physician recommends that you schedule a follow-up appointment in: 1 year. You will receive a reminder letter in the mail in about 10 months reminding you to call and schedule your appointment. If you don't receive this letter, please contact our office. Your physician recommends that you continue on your current medications as directed. Please refer to the Current Medication list given to you today. Your physician has requested that you have en exercise stress myoview. For further information please visit [email or web address]. Please follow instruction sheet, as given.  Thank you for choosing [facility]!

## 2014-06-24 ENCOUNTER — Telehealth: Payer: Self-pay | Admitting: Cardiology

## 2014-06-24 NOTE — Telephone Encounter (Signed)
Mrs. Madelin RearDillon called today stating that \\Anthon  her husband wants to cancel stress test for Monday. States that he [facility]  requesting he pay $637.00 our of pocket expense.  They are going To check with insurance company and see why his out of pocket is so much.  Mrs. Madelin RearDillon will call the office back when she finds out the results of telephone call.

## 2014-06-27 ENCOUNTER — Inpatient Hospital Stay (HOSPITAL_COMMUNITY): Admission: RE | Admit: 2014-06-27 | Payer: PRIVATE HEALTH INSURANCE | Source: Ambulatory Visit

## 2014-06-27 ENCOUNTER — Encounter (HOSPITAL_COMMUNITY): Payer: PRIVATE HEALTH INSURANCE

## 2014-06-28 ENCOUNTER — Ambulatory Visit (HOSPITAL_COMMUNITY): Payer: PRIVATE HEALTH INSURANCE

## 2014-06-28 ENCOUNTER — Encounter (HOSPITAL_COMMUNITY): Payer: PRIVATE HEALTH INSURANCE

## 2014-09-26 ENCOUNTER — Other Ambulatory Visit: Payer: Self-pay | Admitting: Cardiology

## 2015-09-20 ENCOUNTER — Other Ambulatory Visit: Payer: Self-pay | Admitting: Cardiology

## 2015-09-20 MED ORDER — METOPROLOL TARTRATE 25 MG PO TABS: 25.0000 mg | ORAL_TABLET | Freq: Two times a day (BID) | ORAL | Status: DC

## 2015-09-20 NOTE — Telephone Encounter (Signed)
Medication sent to pharmacy  

## 2015-09-20 NOTE — Telephone Encounter (Signed)
REFILL:   metoprolol tartrate (LOPRESSOR) 25 MG tablet  Has appointment on 09/25/15 [facility]

## 2015-09-25 ENCOUNTER — Encounter: Payer: Self-pay | Admitting: Cardiology

## 2015-09-25 ENCOUNTER — Ambulatory Visit (INDEPENDENT_AMBULATORY_CARE_PROVIDER_SITE_OTHER): Payer: PRIVATE HEALTH INSURANCE | Admitting: Cardiology

## 2015-09-25 VITALS — BP 128/72 | HR 55 | Ht 65.0 in | Wt 166.0 lb

## 2015-09-25 DIAGNOSIS — I10 Essential (primary) hypertension: Secondary | ICD-10-CM

## 2015-09-25 DIAGNOSIS — E782 Mixed hyperlipidemia: Secondary | ICD-10-CM | POA: Diagnosis not present

## 2015-09-25 DIAGNOSIS — I2510 Atherosclerotic heart disease of native coronary artery without angina pectoris: Secondary | ICD-10-CM | POA: Diagnosis not present

## 2015-09-25 NOTE — Patient Instructions (Signed)

## 2015-09-25 NOTE — Progress Notes (Signed)
Cardiology Office Note  Date: 09/25/2015   ID: [name], DOB [birth year], MRN [id number]  PCP: Kyle Specking, MD  Primary Cardiologist: Kyle Dell, MD   Chief Complaint  Patient presents with  . Coronary Artery Disease    History of Present Illness: [name] is a 60 y.o. male last seen in February 2016. He is here today with his wife for a follow-up visit. Reports doing very well, no angina symptoms or nitroglycerin use. 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 Also enjoys restoring old cars, has a fairly large collection.  At the last visit we discussed follow-up ischemic testing in light of CABG in 2009, although he did not present for the Cardiolite study. He prefers to continue with medical therapy and observation at this point.  I reviewed his ECG today which showed sinus bradycardia with R' in lead V1 and V2.  Current cardiac medications include aspirin, Lipitor, Cipro, Lopressor, and omega-3 supplements.  Past Medical History  Diagnosis Date  . Coronary atherosclerosis of native coronary artery     Multivessel, LVEF 60%  . Type 2 diabetes mellitus (HCC)   . Essential hypertension, benign   . Hyperlipidemia     Past Surgical History  Procedure Laterality Date  . Coronary artery bypass graft  09/09    LIMA to LAD, SVG to D1/OM1/OM2, SVG to D2, SVG to PDA    Current Outpatient Prescriptions  Medication Sig Dispense Refill  . aspirin 81 MG tablet Take 81 mg by mouth daily.      Marland Kitchen atorvastatin (LIPITOR) 20 MG tablet Take 20 mg by mouth every evening.     . Canagliflozin (INVOKANA) 300 MG TABS Take 1 tablet by mouth daily.    . fexofenadine (ALLEGRA) 180 MG tablet Take 180 mg by mouth as needed for allergies or rhinitis.    Marland Kitchen glyBURIDE-metformin (GLUCOVANCE) 5-500 MG per tablet Take two by mouth twice daily      . lisinopril (PRINIVIL,ZESTRIL) 5 MG tablet Take 2.5 mg by mouth daily.     . metoprolol  tartrate (LOPRESSOR) 25 MG tablet Take 1 tablet (25 mg total) by mouth 2 (two) times daily. 60 tablet 3  . Omega-3 Fatty Acids (FISH OIL) 1000 MG CAPS Take 1 capsule by mouth 2 (two) times daily.     . pantoprazole (PROTONIX) 40 MG tablet Take 40 mg by mouth daily.       No current facility-administered medications for this visit.   Allergies:  Furosemide   Social History: The patient  reports that he quit smoking about 32 years ago. His smoking use included Cigarettes. He has never used smokeless tobacco. He reports that he does not drink alcohol or use illicit drugs.   ROS:  Please see the history of present illness. Otherwise, complete review of systems is positive for occasional reflux.  All other systems are reviewed and negative.   Physical Exam: VS:  BP 128/72 mmHg  Pulse 55  Ht  (1.651 m)  Wt 166 lb (75.297 kg)  BMI 27.62 kg/m2  SpO2 98%, BMI Body mass index is 27.62 kg/(m^2).  Wt Readings from Last 3 Encounters:  09/25/15 166 lb (75.297 kg)  06/14/14 172 lb (78.019 kg)  04/05/13 165 lb 1.9 oz (74.898 kg)    Appears comfortable at rest. HEENT: Conjunctiva and lids normal, oropharynx with moist mucosa.  Neck: Supple, no elevated JVP.  Cardiac: Regular rate and rhythm,  no S3.  Thorax: Stable sternum post sternotomy.  Abdomen: Soft, nontender, bowel sounds present.  Skin: Warm and dry.  Extremities: No pitting edema.  Musculoskeletal: No gross deformities.  Neuropsychiatric: Alert and oriented x3, affect appropriate.  ECG: I personally reviewed the tracing from 06/14/2014 which showed sinus rhythm.  Assessment and Plan:  1. Symptomatically stable multivessel CAD status post CABG in 2009. ECG reviewed. He prefers medical therapy and observation, does not report any significant angina symptoms.  2. Essential hypertension, blood pressure is well controlled today.  3. Hyperlipidemia, on Lipitor. Follows regularly with Dr. Sherril CroonVyas.  Current medicines were  reviewed with the patient today.   Orders Placed This Encounter  Procedures  . EKG 12-Lead    Disposition: FU with me in 1 year.   Signed, Jonelle SidleSamuel G. Aeden Matranga, MD, [facility] 09/25/2015 4:03 PM    [facility] [address], [place], [place] [address] Phone: [phone](336) [phone]; Fax: [phone](336) [phone]

## 2015-12-20 ENCOUNTER — Other Ambulatory Visit: Payer: Self-pay | Admitting: Cardiology

## 2016-07-08 ENCOUNTER — Other Ambulatory Visit: Payer: Self-pay | Admitting: Cardiology

## 2016-10-18 ENCOUNTER — Encounter: Payer: Self-pay | Admitting: *Deleted

## 2016-10-18 NOTE — Progress Notes (Signed)
Cardiology Office Note  Date: 10/21/2016   ID: [name], DOB 02/14/1956, MRN [id number]  PCP: Ignatius SpeckingVyas, Dhruv B, MD  Primary Cardiologist: Nona DellSamuel Kadince Boxley, MD   Chief Complaint  Patient presents with  . Coronary Artery Disease    History of Present Illness: [name] is a 61 y.o. male last seen in May 2017. He presents for a routine follow-up visit. States that he is doing very well, no angina symptoms. Continues to work full time but will likely be retiring within the next year and half. He reports compliance with his medications. He has cut back carbohydrates in his diet and reports very good blood pressure control, last hemoglobin A1c down to 5.6%.  We have discussed follow-up ischemic testing in light of prior CABG in 2009, he has preferred to hold off unless symptoms intervene. I personally reviewed his ECG today which shows sinus bradycardia with incomplete right bundle branch block.  We went over his medications which are outlined below. He reports compliance with Lipitor. He had recent lipids with LDL 85.  Past Medical History:  Diagnosis Date  . Coronary atherosclerosis of native coronary artery    Multivessel, LVEF 60%  . Essential hypertension, benign   . Hyperlipidemia   . Type 2 diabetes mellitus (HCC)     Past Surgical History:  Procedure Laterality Date  . CORONARY ARTERY BYPASS GRAFT  09/09   LIMA to LAD, SVG to D1/OM1/OM2, SVG to D2, SVG to PDA    Current Outpatient Prescriptions  Medication Sig Dispense Refill  . aspirin 81 MG tablet Take 81 mg by mouth daily.      Marland Kitchen. atorvastatin (LIPITOR) 20 MG tablet Take 20 mg by mouth every evening.     . fexofenadine (ALLEGRA) 180 MG tablet Take 180 mg by mouth as needed for allergies or rhinitis.    Marland Kitchen. glyBURIDE-metformin (GLUCOVANCE) 5-500 MG per tablet Take 1 tablet by mouth 2 (two) times daily. Take two by mouth twice daily      . INVOKANA 100 MG TABS tablet TAKE ONE TABLET BY MOUTH DAILY (DOSE reduced)]  12   . lisinopril (PRINIVIL,ZESTRIL) 5 MG tablet Take 2.5 mg by mouth daily.     . metoprolol tartrate (LOPRESSOR) 25 MG tablet TAKE ONE TABLET BY MOUTH TWICE DAILY 60 tablet 6  . Omega-3 Fatty Acids (FISH OIL) 1000 MG CAPS Take 1 capsule by mouth 2 (two) times daily.     . pantoprazole (PROTONIX) 40 MG tablet Take 40 mg by mouth daily.       No current facility-administered medications for this visit.    Allergies:  Furosemide   Social History: The patient  reports that he quit smoking about 33 years ago. His smoking use included Cigarettes. He has never used smokeless tobacco. He reports that he does not drink alcohol or use drugs.   ROS:  Please see the history of present illness. Otherwise, complete review of systems is positive for none.  All other systems are reviewed and negative.   Physical Exam: VS:  BP 126/66   Pulse 63   Ht 5\' 5"  (1.651 m)   Wt 154 lb 12.8 oz (70.2 kg)   SpO2 98%   BMI 25.76 kg/m , BMI Body mass index is 25.76 kg/m.  Wt Readings from Last 3 Encounters:  10/21/16 154 lb 12.8 oz (70.2 kg)  09/25/15 166 lb (75.3 kg)  06/14/14 172 lb (78 kg)    Appears comfortable at rest. HEENT: Conjunctiva and  lids normal, oropharynx.  Neck: Supple, no elevated JVP.  Cardiac: Regular rate and rhythm, no S3.  Thorax: Stable sternum post sternotomy.  Abdomen: Soft, nontender, bowel sounds present.  Skin: Warm and dry.  Extremities: No pitting edema.  Musculoskeletal: No gross deformities.  Neuropsychiatric: Alert and oriented x3, affect appropriate.  ECG: I personally reviewed the tracing from 6 (09/09/2015 which showed sinus bradycardia with small R' in lead V1 and V2.  Recent Labwork:  Cholesterol 142, HDL 37, LDL 85  Assessment and Plan:  1. Multivessel CAD status post CABG in 2009. He continues to prefer observation in the absence of any angina symptoms on medical therapy. I did review his ECG today.  2. Type 2 diabetes mellitus, reports good control  with recent hemoglobin A1c 5.6%. He has cut back carbohydrates in his diet significantly. Continues to follow with Dr. Sherril Croon.  3. Hyperlipidemia on Lipitor. Recent LDL 85.  4. History of hypertension, blood pressure is well controlled today.  Current medicines were reviewed with the patient today.   Orders Placed This Encounter  Procedures  . EKG 12-Lead    Disposition: Follow-up in one year, sooner if needed.  Signed, Jonelle Sidle, MD, [facility] 10/21/2016 4:34 PM    [facility] at [facility] [address] [place], [place], [place] [address] Phone: [phone]; Fax: [phone]

## 2016-10-21 ENCOUNTER — Encounter: Payer: Self-pay | Admitting: Cardiology

## 2016-10-21 ENCOUNTER — Ambulatory Visit (INDEPENDENT_AMBULATORY_CARE_PROVIDER_SITE_OTHER): Payer: PRIVATE HEALTH INSURANCE | Admitting: Cardiology

## 2016-10-21 VITALS — BP 126/66 | HR 63 | Ht 65.0 in | Wt 154.8 lb

## 2016-10-21 DIAGNOSIS — E118 Type 2 diabetes mellitus with unspecified complications: Secondary | ICD-10-CM | POA: Diagnosis not present

## 2016-10-21 DIAGNOSIS — I10 Essential (primary) hypertension: Secondary | ICD-10-CM | POA: Diagnosis not present

## 2016-10-21 DIAGNOSIS — I2510 Atherosclerotic heart disease of native coronary artery without angina pectoris: Secondary | ICD-10-CM

## 2016-10-21 DIAGNOSIS — E782 Mixed hyperlipidemia: Secondary | ICD-10-CM | POA: Diagnosis not present

## 2016-10-21 NOTE — Patient Instructions (Signed)

## 2017-03-12 ENCOUNTER — Other Ambulatory Visit: Payer: Self-pay | Admitting: Cardiology

## 2017-10-10 ENCOUNTER — Other Ambulatory Visit: Payer: Self-pay | Admitting: Cardiology

## 2017-10-30 ENCOUNTER — Encounter: Payer: Self-pay | Admitting: *Deleted

## 2017-10-30 NOTE — Progress Notes (Signed)
Cardiology Office Note  Date: 10/31/2017   ID: [name], DOB 1955-07-14, MRN [id number]  PCP: [name], MD  Primary Cardiologist: [name], MD   Chief Complaint  Patient presents with  . Coronary Artery Disease    History of Present Illness: [name] is a 62 y.o. male last seen in July 2018.  He is here today with his wife for a follow-up visit.  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  He does not report any angina symptoms or nitroglycerin use.  He describes activities well exceeding 4 METS without symptoms.  No unusual shortness of breath, palpitations, or syncope.  He continues to follow with Dr. Sherril Croon, we are requesting his most recent lab work.  He recalls recent hemoglobin A1c of 6.6.  Current cardiac regimen includes aspirin, Lipitor, lisinopril, and Lopressor.  I personally reviewed his ECG today which shows a sinus rhythm with R' in lead V1 and V2.  Past Medical History:  Diagnosis Date  . Coronary atherosclerosis of native coronary artery    Multivessel, LVEF 60%  . Essential hypertension, benign   . Hyperlipidemia   . Type 2 diabetes mellitus (HCC)     Past Surgical History:  Procedure Laterality Date  . CORONARY ARTERY BYPASS GRAFT  09/09   LIMA to LAD, SVG to D1/OM1/OM2, SVG to D2, SVG to PDA    Current Outpatient Medications  Medication Sig Dispense Refill  . aspirin 81 MG tablet Take 81 mg by mouth daily.      Marland Kitchen atorvastatin (LIPITOR) 20 MG tablet Take 20 mg by mouth every evening.     . fexofenadine (ALLEGRA) 180 MG tablet Take 180 mg by mouth as needed for allergies or rhinitis.    Marland Kitchen glyBURIDE-metformin (GLUCOVANCE) 5-500 MG per tablet Take 1 tablet by mouth 2 (two) times daily. Take two by mouth twice daily      . INVOKANA 100 MG TABS tablet TAKE ONE TABLET BY MOUTH DAILY (DOSE reduced)]  12  . lisinopril (PRINIVIL,ZESTRIL) 5 MG tablet Take 2.5 mg by mouth daily.     . metoprolol tartrate (LOPRESSOR) 25 MG  tablet TAKE ONE TABLET BY MOUTH TWICE DAILY 60 tablet 6  . Omega-3 Fatty Acids (FISH OIL) 1000 MG CAPS Take 1 capsule by mouth 2 (two) times daily.     . pantoprazole (PROTONIX) 40 MG tablet Take 40 mg by mouth daily.       No current facility-administered medications for this visit.    Allergies:  Furosemide   Social History: The patient  reports that he quit smoking about 34 years ago. His smoking use included cigarettes. He has never used smokeless tobacco. He reports that he does not drink alcohol or use drugs.   ROS:  Please see the history of present illness. Otherwise, complete review of systems is positive for none.  All other systems are reviewed and negative.   Physical Exam: VS:  BP (!) 112/50   Pulse 67   Ht 5\' 5"  (1.651 m)   Wt 156 lb (70.8 kg)   SpO2 97%   BMI 25.96 kg/m , BMI Body mass index is 25.96 kg/m.  Wt Readings from Last 3 Encounters:  10/31/17 156 lb (70.8 kg)  10/21/16 154 lb 12.8 oz (70.2 kg)  09/25/15 166 lb (75.3 kg)    General: Patient appears comfortable at rest. HEENT: Conjunctiva and lids normal, oropharynx clear. Neck: Supple, no elevated JVP or carotid bruits, no thyromegaly.  Lungs: Clear to auscultation, nonlabored breathing at rest. Cardiac: Regular rate and rhythm, no S3 or significant systolic murmur. Abdomen: Soft, nontender, bowel sounds present. Extremities: No pitting edema, distal pulses 2+. Skin: Warm and dry. Musculoskeletal: No kyphosis. Neuropsychiatric: Alert and oriented x3, affect grossly appropriate.  ECG: I personally reviewed the tracing from 10/21/2016 which showed sinus bradycardia with incomplete right bundle branch block.  Recent Labwork:  Cholesterol 142, HDL 37, LDL 85  Assessment and Plan:  1.  Multivessel CAD status post CABG in 2009.  He has continued to do well on medical therapy, reports no obvious angina symptoms or functional decline with activities well exceeding 4 METS.  He remains comfortable with  observation on medical therapy.  ECG reviewed and stable.  2.  Type 2 diabetes mellitus, recalls recent hemoglobin A1c of 6.6%.  He continues to follow with Dr. Sherril CroonVyas.  3.  Mixed hyperlipidemia, continues on Lipitor.  Requesting most recent lab work from Dr. Sherril CroonVyas.  4.  Essential hypertension, blood pressure low normal today.  He is asymptomatic.  Current medicines were reviewed with the patient today.   Orders Placed This Encounter  Procedures  . EKG 12-Lead    Disposition: Follow-up in 1 year, sooner if needed.  Signed, Jonelle SidleSamuel G. Maicey Barrientez, MD, [facility] 10/31/2017 3:56 PM    [facility] [address], [place], [place] [address] Phone: [phone](336) [phone]; Fax: [phone](336) [phone]

## 2017-10-31 ENCOUNTER — Encounter: Payer: Self-pay | Admitting: Cardiology

## 2017-10-31 ENCOUNTER — Ambulatory Visit: Payer: PRIVATE HEALTH INSURANCE | Admitting: Cardiology

## 2017-10-31 VITALS — BP 112/50 | HR 67 | Ht 65.0 in | Wt 156.0 lb

## 2017-10-31 DIAGNOSIS — I10 Essential (primary) hypertension: Secondary | ICD-10-CM

## 2017-10-31 DIAGNOSIS — E782 Mixed hyperlipidemia: Secondary | ICD-10-CM

## 2017-10-31 DIAGNOSIS — E118 Type 2 diabetes mellitus with unspecified complications: Secondary | ICD-10-CM

## 2017-10-31 DIAGNOSIS — I2510 Atherosclerotic heart disease of native coronary artery without angina pectoris: Secondary | ICD-10-CM | POA: Diagnosis not present

## 2017-10-31 NOTE — Patient Instructions (Signed)

## 2018-04-02 ENCOUNTER — Other Ambulatory Visit: Payer: Self-pay | Admitting: Cardiology

## 2018-07-14 ENCOUNTER — Inpatient Hospital Stay (HOSPITAL_COMMUNITY): Payer: PRIVATE HEALTH INSURANCE

## 2018-07-14 ENCOUNTER — Encounter (HOSPITAL_COMMUNITY): Payer: Self-pay | Admitting: General Practice

## 2018-07-14 ENCOUNTER — Other Ambulatory Visit: Payer: Self-pay

## 2018-07-14 ENCOUNTER — Inpatient Hospital Stay: Payer: Self-pay

## 2018-07-14 ENCOUNTER — Inpatient Hospital Stay (HOSPITAL_COMMUNITY)
Admission: AD | Admit: 2018-07-14 | Discharge: 2018-07-18 | DRG: 273 | Disposition: A | Discharge status: Home / Self Care | Payer: PRIVATE HEALTH INSURANCE | Source: Other Acute Inpatient Hospital | Attending: Cardiovascular Disease | Admitting: Cardiovascular Disease

## 2018-07-14 DIAGNOSIS — I340 Nonrheumatic mitral (valve) insufficiency: Secondary | ICD-10-CM

## 2018-07-14 DIAGNOSIS — I214 Non-ST elevation (NSTEMI) myocardial infarction: Secondary | ICD-10-CM | POA: Diagnosis present

## 2018-07-14 DIAGNOSIS — I110 Hypertensive heart disease with heart failure: Secondary | ICD-10-CM | POA: Diagnosis present

## 2018-07-14 DIAGNOSIS — I313 Pericardial effusion (noninflammatory): Secondary | ICD-10-CM | POA: Diagnosis present

## 2018-07-14 DIAGNOSIS — N179 Acute kidney failure, unspecified: Secondary | ICD-10-CM | POA: Diagnosis present

## 2018-07-14 DIAGNOSIS — I2510 Atherosclerotic heart disease of native coronary artery without angina pectoris: Secondary | ICD-10-CM | POA: Diagnosis present

## 2018-07-14 DIAGNOSIS — E785 Hyperlipidemia, unspecified: Secondary | ICD-10-CM | POA: Diagnosis present

## 2018-07-14 DIAGNOSIS — I5043 Acute on chronic combined systolic (congestive) and diastolic (congestive) heart failure: Secondary | ICD-10-CM | POA: Diagnosis present

## 2018-07-14 DIAGNOSIS — J069 Acute upper respiratory infection, unspecified: Secondary | ICD-10-CM | POA: Diagnosis not present

## 2018-07-14 DIAGNOSIS — R059 Cough, unspecified: Secondary | ICD-10-CM

## 2018-07-14 DIAGNOSIS — I10 Essential (primary) hypertension: Secondary | ICD-10-CM | POA: Diagnosis present

## 2018-07-14 DIAGNOSIS — I255 Ischemic cardiomyopathy: Secondary | ICD-10-CM | POA: Diagnosis present

## 2018-07-14 DIAGNOSIS — E875 Hyperkalemia: Secondary | ICD-10-CM | POA: Diagnosis present

## 2018-07-14 DIAGNOSIS — I483 Typical atrial flutter: Secondary | ICD-10-CM | POA: Diagnosis not present

## 2018-07-14 DIAGNOSIS — Z79899 Other long term (current) drug therapy: Secondary | ICD-10-CM

## 2018-07-14 DIAGNOSIS — Z888 Allergy status to other drugs, medicaments and biological substances status: Secondary | ICD-10-CM

## 2018-07-14 DIAGNOSIS — F4321 Adjustment disorder with depressed mood: Secondary | ICD-10-CM | POA: Diagnosis present

## 2018-07-14 DIAGNOSIS — I509 Heart failure, unspecified: Secondary | ICD-10-CM | POA: Diagnosis not present

## 2018-07-14 DIAGNOSIS — I25810 Atherosclerosis of coronary artery bypass graft(s) without angina pectoris: Secondary | ICD-10-CM | POA: Diagnosis present

## 2018-07-14 DIAGNOSIS — Z87891 Personal history of nicotine dependence: Secondary | ICD-10-CM

## 2018-07-14 DIAGNOSIS — I272 Pulmonary hypertension, unspecified: Secondary | ICD-10-CM | POA: Diagnosis present

## 2018-07-14 DIAGNOSIS — Z7984 Long term (current) use of oral hypoglycemic drugs: Secondary | ICD-10-CM

## 2018-07-14 DIAGNOSIS — I5021 Acute systolic (congestive) heart failure: Secondary | ICD-10-CM | POA: Diagnosis not present

## 2018-07-14 DIAGNOSIS — Z8249 Family history of ischemic heart disease and other diseases of the circulatory system: Secondary | ICD-10-CM

## 2018-07-14 DIAGNOSIS — I480 Paroxysmal atrial fibrillation: Secondary | ICD-10-CM | POA: Diagnosis present

## 2018-07-14 DIAGNOSIS — Z7982 Long term (current) use of aspirin: Secondary | ICD-10-CM

## 2018-07-14 DIAGNOSIS — E119 Type 2 diabetes mellitus without complications: Secondary | ICD-10-CM

## 2018-07-14 DIAGNOSIS — E1165 Type 2 diabetes mellitus with hyperglycemia: Secondary | ICD-10-CM | POA: Diagnosis present

## 2018-07-14 DIAGNOSIS — I4892 Unspecified atrial flutter: Secondary | ICD-10-CM | POA: Diagnosis present

## 2018-07-14 DIAGNOSIS — Z452 Encounter for adjustment and management of vascular access device: Secondary | ICD-10-CM

## 2018-07-14 DIAGNOSIS — R05 Cough: Secondary | ICD-10-CM

## 2018-07-14 DIAGNOSIS — Z560 Unemployment, unspecified: Secondary | ICD-10-CM

## 2018-07-14 LAB — CBC WITH DIFFERENTIAL/PLATELET
Abs Immature Granulocytes: 0.08 10*3/uL — ABNORMAL HIGH (ref 0.00–0.07)
Basophils Absolute: 0 10*3/uL (ref 0.0–0.1)
Basophils Relative: 0 %
Eosinophils Absolute: 0 10*3/uL (ref 0.0–0.5)
Eosinophils Relative: 0 %
HCT: 47.3 % (ref 39.0–52.0)
Hemoglobin: 15.2 g/dL (ref 13.0–17.0)
Immature Granulocytes: 0 %
Lymphocytes Relative: 5 %
Lymphs Abs: 1 10*3/uL (ref 0.7–4.0)
MCH: 29 pg (ref 26.0–34.0)
MCHC: 32.1 g/dL (ref 30.0–36.0)
MCV: 90.1 fL (ref 80.0–100.0)
Monocytes Absolute: 1.8 10*3/uL — ABNORMAL HIGH (ref 0.1–1.0)
Monocytes Relative: 9 %
Neutro Abs: 16.2 10*3/uL — ABNORMAL HIGH (ref 1.7–7.7)
Neutrophils Relative %: 86 %
Platelets: 323 10*3/uL (ref 150–400)
RBC: 5.25 MIL/uL (ref 4.22–5.81)
RDW: 13 % (ref 11.5–15.5)
WBC: 19.1 10*3/uL — ABNORMAL HIGH (ref 4.0–10.5)
nRBC: 0 % (ref 0.0–0.2)

## 2018-07-14 LAB — RESPIRATORY PANEL BY PCR

## 2018-07-14 LAB — MRSA PCR SCREENING: MRSA by PCR: NEGATIVE

## 2018-07-14 LAB — COMPREHENSIVE METABOLIC PANEL
ALT: 55 U/L — ABNORMAL HIGH (ref 0–44)
AST: 125 U/L — ABNORMAL HIGH (ref 15–41)
Albumin: 3.3 g/dL — ABNORMAL LOW (ref 3.5–5.0)
Alkaline Phosphatase: 63 U/L (ref 38–126)
Anion gap: 13 (ref 5–15)
BUN: 34 mg/dL — ABNORMAL HIGH (ref 8–23)
CO2: 19 mmol/L — ABNORMAL LOW (ref 22–32)
Calcium: 8.3 mg/dL — ABNORMAL LOW (ref 8.9–10.3)
Chloride: 104 mmol/L (ref 98–111)
Creatinine, Ser: 1.53 mg/dL — ABNORMAL HIGH (ref 0.61–1.24)
GFR calc Af Amer: 56 mL/min — ABNORMAL LOW (ref >60)
GFR calc non Af Amer: 48 mL/min — ABNORMAL LOW (ref >60)
Glucose, Bld: 316 mg/dL — ABNORMAL HIGH (ref 70–99)
POTASSIUM: 4.7 mmol/L (ref 3.5–5.1)
Sodium: 136 mmol/L (ref 135–145)
TOTAL PROTEIN: 6.8 g/dL (ref 6.5–8.1)
Total Bilirubin: 1.1 mg/dL (ref 0.3–1.2)

## 2018-07-14 LAB — MAGNESIUM: Magnesium: 2.3 mg/dL (ref 1.7–2.4)

## 2018-07-14 LAB — HEMOGLOBIN A1C
Hgb A1c MFr Bld: 7.5 % — ABNORMAL HIGH (ref 4.8–5.6)
Mean Plasma Glucose: 168.55 mg/dL

## 2018-07-14 LAB — ECHOCARDIOGRAM COMPLETE
Height: 65 in
Weight: 2472.68 oz

## 2018-07-14 LAB — BRAIN NATRIURETIC PEPTIDE: B Natriuretic Peptide: 1618.1 pg/mL — ABNORMAL HIGH (ref 0.0–100.0)

## 2018-07-14 LAB — GLUCOSE, CAPILLARY
GLUCOSE-CAPILLARY: 278 mg/dL — AB (ref 70–99)
Glucose-Capillary: 281 mg/dL — ABNORMAL HIGH (ref 70–99)

## 2018-07-14 LAB — TROPONIN I
Troponin I: 22.1 ng/mL (ref <0.03)
Troponin I: 18.09 ng/mL (ref <0.03)

## 2018-07-14 LAB — INFLUENZA PANEL BY PCR (TYPE A & B)
INFLBPCR: NEGATIVE
Influenza A By PCR: NEGATIVE

## 2018-07-14 LAB — TSH: TSH: 0.476 u[IU]/mL (ref 0.350–4.500)

## 2018-07-14 LAB — APTT: aPTT: 31 seconds (ref 24–36)

## 2018-07-14 LAB — HEPARIN LEVEL (UNFRACTIONATED): Heparin Unfractionated: 0.59 IU/mL (ref 0.30–0.70)

## 2018-07-14 MED ORDER — ATORVASTATIN CALCIUM 10 MG PO TABS
20.0000 mg | ORAL_TABLET | Freq: Every evening | ORAL | Status: DC
  Administered 2018-07-14: 20 mg via ORAL
  Filled 2018-07-14: qty 2

## 2018-07-14 MED ORDER — ONDANSETRON HCL 4 MG/2ML IJ SOLN
4.0000 mg | Freq: Four times a day (QID) | INTRAMUSCULAR | Status: DC | PRN
  Administered 2018-07-14 – 2018-07-15 (×2): 4 mg via INTRAVENOUS
  Filled 2018-07-14 (×3): qty 2

## 2018-07-14 MED ORDER — AMIODARONE HCL IN DEXTROSE 360-4.14 MG/200ML-% IV SOLN
60.0000 mg/h | INTRAVENOUS | Status: AC
  Administered 2018-07-14 (×3): 60 mg/h via INTRAVENOUS
  Filled 2018-07-14 (×3): qty 200

## 2018-07-14 MED ORDER — SODIUM CHLORIDE 0.9% FLUSH
3.0000 mL | Freq: Two times a day (BID) | INTRAVENOUS | Status: DC
  Administered 2018-07-14 – 2018-07-16 (×3): 3 mL via INTRAVENOUS

## 2018-07-14 MED ORDER — METOPROLOL TARTRATE 25 MG PO TABS
25.0000 mg | ORAL_TABLET | Freq: Two times a day (BID) | ORAL | Status: DC
  Administered 2018-07-14 – 2018-07-15 (×2): 25 mg via ORAL
  Filled 2018-07-14 (×2): qty 1

## 2018-07-14 MED ORDER — AMIODARONE LOAD VIA INFUSION
150.0000 mg | Freq: Once | INTRAVENOUS | Status: AC
  Administered 2018-07-14: 150 mg via INTRAVENOUS
  Filled 2018-07-14: qty 83.34

## 2018-07-14 MED ORDER — DIGOXIN 0.25 MG/ML IJ SOLN
0.2500 mg | Freq: Once | INTRAMUSCULAR | Status: AC
  Administered 2018-07-14: 0.25 mg via INTRAVENOUS
  Filled 2018-07-14: qty 2

## 2018-07-14 MED ORDER — AMIODARONE HCL IN DEXTROSE 360-4.14 MG/200ML-% IV SOLN
30.0000 mg/h | INTRAVENOUS | Status: DC
  Administered 2018-07-15 – 2018-07-17 (×7): 30 mg/h via INTRAVENOUS
  Filled 2018-07-14 (×8): qty 200

## 2018-07-14 MED ORDER — ASPIRIN 81 MG PO TABS: 81.0000 mg | ORAL_TABLET | Freq: Every day | ORAL | Status: DC

## 2018-07-14 MED ORDER — SODIUM CHLORIDE 0.9 % IV SOLN
INTRAVENOUS | Status: DC
  Administered 2018-07-15: 06:00:00 via INTRAVENOUS

## 2018-07-14 MED ORDER — SODIUM CHLORIDE 0.9 % IV SOLN: 250.0000 mL | INTRAVENOUS | Status: DC | PRN

## 2018-07-14 MED ORDER — AZITHROMYCIN 500 MG PO TABS
500.0000 mg | ORAL_TABLET | Freq: Every day | ORAL | Status: DC
  Administered 2018-07-14 – 2018-07-18 (×5): 500 mg via ORAL
  Filled 2018-07-14 (×2): qty 1
  Filled 2018-07-14: qty 2
  Filled 2018-07-14 (×2): qty 1

## 2018-07-14 MED ORDER — ASPIRIN EC 81 MG PO TBEC
81.0000 mg | DELAYED_RELEASE_TABLET | Freq: Every day | ORAL | Status: DC
  Administered 2018-07-15 – 2018-07-18 (×3): 81 mg via ORAL
  Filled 2018-07-14 (×3): qty 1

## 2018-07-14 MED ORDER — NITROGLYCERIN 0.4 MG SL SUBL: 0.4000 mg | SUBLINGUAL_TABLET | SUBLINGUAL | Status: DC | PRN

## 2018-07-14 MED ORDER — INSULIN ASPART 100 UNIT/ML ~~LOC~~ SOLN
0.0000 [IU] | Freq: Three times a day (TID) | SUBCUTANEOUS | Status: DC
  Administered 2018-07-14: 8 [IU] via SUBCUTANEOUS
  Administered 2018-07-15 (×2): 11 [IU] via SUBCUTANEOUS

## 2018-07-14 MED ORDER — ACETAMINOPHEN 325 MG PO TABS
650.0000 mg | ORAL_TABLET | ORAL | Status: DC | PRN
  Administered 2018-07-15: 650 mg via ORAL
  Filled 2018-07-14: qty 2

## 2018-07-14 MED ORDER — LORATADINE 10 MG PO TABS
10.0000 mg | ORAL_TABLET | Freq: Every day | ORAL | Status: DC
  Administered 2018-07-15 – 2018-07-18 (×4): 10 mg via ORAL
  Filled 2018-07-14 (×4): qty 1

## 2018-07-14 MED ORDER — SODIUM CHLORIDE 0.9 % IV SOLN
1.0000 g | Freq: Every day | INTRAVENOUS | Status: DC
  Administered 2018-07-14 – 2018-07-18 (×5): 1 g via INTRAVENOUS
  Filled 2018-07-14 (×5): qty 10

## 2018-07-14 MED ORDER — HEPARIN (PORCINE) 25000 UT/250ML-% IV SOLN
1700.0000 [IU]/h | INTRAVENOUS | Status: DC
  Administered 2018-07-14: 1000 [IU]/h via INTRAVENOUS
  Administered 2018-07-15: 1550 [IU]/h via INTRAVENOUS
  Administered 2018-07-16 (×2): 1700 [IU]/h via INTRAVENOUS
  Filled 2018-07-14 (×4): qty 250

## 2018-07-14 MED ORDER — PANTOPRAZOLE SODIUM 40 MG PO TBEC
40.0000 mg | DELAYED_RELEASE_TABLET | Freq: Every day | ORAL | Status: DC
  Administered 2018-07-14 – 2018-07-18 (×5): 40 mg via ORAL
  Filled 2018-07-14 (×5): qty 1

## 2018-07-14 MED ORDER — SODIUM CHLORIDE 0.9% FLUSH: 3.0000 mL | INTRAVENOUS | Status: DC | PRN

## 2018-07-14 NOTE — Progress Notes (Signed)
Kristen RN called for additional hands, pt in SVT 170-180's. RN Bonita Quin, Dr. Excell Seltzer and Dr. Gala Romney at bedside. Started 150 mg Amiodarone bolus, a second 150 mg bolus given per verbal order from Dr. Gala Romney. Pt transferred to 2H04

## 2018-07-14 NOTE — Progress Notes (Signed)
Troponin 22.1 and BNP 1618.  Patient denies CP at this time. Janee Morn PA notified.

## 2018-07-14 NOTE — Progress Notes (Signed)
Amiodarone bolus gtt started and patient transferred to ICU via  Bed with Dr. Excell Seltzer at bedside.  Report given to [facility] ICU.

## 2018-07-14 NOTE — Progress Notes (Signed)
Spoke with Zollie Scale RN regarding PICC placement. Informed that patient PICC would be placed in AM. RN requested a 3rd IV for incompatible medications.  IV RN relayed a IV RN will come and placed a 3rd PIV.

## 2018-07-14 NOTE — Progress Notes (Signed)
  Echocardiogram 2D Echocardiogram has been performed.  Delcie Roch 07/14/2018, 5:41 PM

## 2018-07-14 NOTE — Progress Notes (Signed)
Notified by Telemetry HR increased to 150, patient found to be alternating SR, with runs of SVT initially then HR increased up to 170-180 SVT, BP 98/78.  C/o nausea and having dry heaving.  Denies CP SOB. Skin diaphoretic.  R. Barrett PA notified.

## 2018-07-14 NOTE — H&P (Signed)
Cardiology Admission History and Physical:   Patient ID: [name] MRN: [id number]; DOB: 03-20-1956   Admission date: 07/14/2018  Primary Care Provider: [name], MD Primary Cardiologist: [name], MD  Primary Electrophysiologist:  None   Chief Complaint:  Afib RVR, elevated troponin, new onset systolic heart failure  Patient Profile:   [name] is a 63 y.o. 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   History of Present Illness:   [name] was last seen in clinic on 10/31/17 with Dr. Diona Browner. He was doing well at that time and was able to complete more than 4.0 METS. He was maintained on ASA, lipitor, lisinopril, and lopressor.   He presented to [facility] ER 07/13/18 with nausea and vomiting for the past few weeks. He was found to be in Afib RVR in the 150-160s. He was started on a cardizem drip and admitted. He underwent a TEE/DCCV with successful cardioversion to NSR. His troponins were elevated: 0.97 --> 1.32 --> 2.27 --> 2.44. EKG prior to cardioversion with Afib RVR with ST depression in precordial leads. EKG following cardioversion reviewed and showed sinus rhythm with a TWI in V2, resolved on subsequent tracings. TTE was performed and showed a reduced EF to 20-25%. CXR with left pleural effusion and consolidation suggesting PNA. IV rocephin was given x 1 dose. CT abdomen pelvis for nausea and vomiting largely negative for acute findings. Leukocytosis with WBC 22 and elevated liver enzymes. Lipase and amylase were WNL. ProBNP elevated to 6612 from 1805 the day before. Renal function impaired with sCr 1.51. He is anticoagulated with xarelto following cardioversion. Cardiology was unavailable today at OSH and requested transfer to [facility].   Of note, the patient's father had been ill for over 1 week. Police were dispatched to the father's residence for a welfare check and found him deceased. The sheriff's  office called the patient and informed him while in transit to the hospital with [facility].   Per nursing, patient continues to have a cough on arrival. He is afebrile with BP 101/70. He remains in NSR in the low 100s. He has been placed on droplet precautions. Case discussed with DOD Dr. Excell Seltzer.   Patient describes dry cough for the past three weeks along with exertional chest pain relieved with rest and DOE.     Past Medical History:  Diagnosis Date   Coronary atherosclerosis of native coronary artery    Multivessel, LVEF 60%   Essential hypertension, benign    Hyperlipidemia    Type 2 diabetes mellitus (HCC)     Past Surgical History:  Procedure Laterality Date   CORONARY ARTERY BYPASS GRAFT  09/09   LIMA to LAD, SVG to D1/OM1/OM2, SVG to D2, SVG to PDA     Medications Prior to Admission: Prior to Admission medications   Medication Sig Start Date End Date Taking? Authorizing Provider  aspirin 81 MG tablet Take 81 mg by mouth daily.      [provider]  atorvastatin (LIPITOR) 20 MG tablet Take 20 mg by mouth every evening.  02/07/12   Jonelle Sidle, MD  fexofenadine (ALLEGRA) 180 MG tablet Take 180 mg by mouth as needed for allergies or rhinitis.    [provider]  glyBURIDE-metformin (GLUCOVANCE) 5-500 MG per tablet Take 1 tablet by mouth 2 (two) times daily. Take two by mouth twice daily      [provider]  INVOKANA 100 MG TABS tablet TAKE ONE TABLET BY MOUTH DAILY (DOSE reduced)] 10/04/16   [provider]  lisinopril (PRINIVIL,ZESTRIL) 5 MG tablet Take 2.5 mg by mouth daily.     [provider]  metoprolol tartrate (LOPRESSOR) 25 MG tablet TAKE ONE TABLET BY MOUTH TWICE DAILY 04/02/18   Jonelle Sidle, MD  Omega-3 Fatty Acids (FISH OIL) 1000 MG CAPS Take 1 capsule by mouth 2 (two) times daily.     [provider]  pantoprazole (PROTONIX) 40 MG tablet Take 40 mg by mouth daily.      [provider]      Allergies:    Allergies  Allergen Reactions   Furosemide Nausea Only    REACTION: unspecified    Social History:   Social History   Socioeconomic History   Marital status: Married    Spouse name: [name]   Number of children: Not on file   Years of education: Not on file   Highest education level: Not on file  Occupational History    Employer: UNEMPLOYED  Social Needs   Financial resource strain: Not on file   Food insecurity:    Worry: Not on file    Inability: Not on file   Transportation needs:    Medical: Not on file    Non-medical: Not on file  Tobacco Use   Smoking status: Former Smoker    Types: Cigarettes    Last attempt to quit: 04/23/1983    Years since quitting: 35.2   Smokeless tobacco: Never Used   Tobacco comment: Year Quit: 1985  Substance and Sexual Activity   Alcohol use: No    Alcohol/week: 0.0 standard drinks   Drug use: No   Sexual activity: Not on file  Lifestyle   Physical activity:    Days per week: Not on file    Minutes per session: Not on file   Stress: Not on file  Relationships   Social connections:    Talks on phone: Not on file    Gets together: Not on file    Attends religious service: Not on file    Active member of club or organization: Not on file    Attends meetings of clubs or organizations: Not on file    Relationship status: Not on file   Intimate partner violence:    Fear of current or ex partner: Not on file    Emotionally abused: Not on file    Physically abused: Not on file    Forced sexual activity: Not on file  Other Topics Concern   Not on file  Social History Narrative   Not on file    Family History:   The patient's family history includes Coronary artery disease in his unknown relative.    ROS:  Please see the history of present illness.  All other ROS reviewed and negative.     Physical Exam/Data:  There were no vitals filed for this visit. No intake or output data in the 24  hours ending 07/14/18 1409 Last 3 Weights 10/31/2017 10/21/2016 09/25/2015  Weight (lbs) 156 lb 154 lb 12.8 oz 166 lb  Weight (kg) 70.761 kg 70.217 kg 75.297 kg     There is no height or weight on file to calculate BMI.  General:  Well nourished, well developed, in no acute distress HEENT: normal Lymph: no adenopathy Neck: no JVD Endocrine:  No thryomegaly Vascular: No carotid bruits; FA pulses 2+ bilaterally Cardiac:  normal S1, S2; tachy and  regular; no murmur  Lungs:  Rhonchi bilaterally  Abd: soft, nontender, no hepatomegaly  Ext: no edema Musculoskeletal:  No deformities, BUE and BLE strength normal and equal Skin: warm and dry  Neuro:  CNs 2-12 intact, no focal abnormalities noted Psych:  Normal affect   EKG:  The ECG that was done was personally reviewed and demonstrates ST depression V2/3/4/5  Relevant CV Studies:  Echo 07/14/18:  Dilated left ventricle - moderate  Severely decreased left ventricular systolic function, ejection fraction  23%  Segmental wall motion abnormality - (anterolateral, inferior and  inferolateral)  Diastolic dysfunction - grade I (normal filling pressures)  Normal right ventricular systolic function  Aortic sclerosis  Mitral regurgitation - mild  Laboratory Data:  ChemistryNo results for input(s): NA, K, CL, CO2, GLUCOSE, BUN, CREATININE, CALCIUM, GFRNONAA, GFRAA, ANIONGAP in the last 168 hours.  No results for input(s): PROT, ALBUMIN, AST, ALT, ALKPHOS, BILITOT in the last 168 hours. HematologyNo results for input(s): WBC, RBC, HGB, HCT, MCV, MCH, MCHC, RDW, PLT in the last 168 hours. Cardiac EnzymesNo results for input(s): TROPONINI in the last 168 hours. No results for input(s): TROPIPOC in the last 168 hours.  BNPNo results for input(s): BNP, PROBNP in the last 168 hours.  DDimer No results for input(s): DDIMER in the last 168 hours.  Radiology/Studies:  No results found.  Assessment and Plan:   1. Atrial fibrillation with RVR -  patient underwent successful TEE-guided cardioversion to NSR on 07/13/18 - he was placed on xarelto for anticoagulation - he remains in sinus-sinus tach in the low 100s   2. New onset systolic heart failure - echo with EF of 20-25% - proBNP elevated > 6000 at OSH, up from 1805 the day prior - unclear if patient has been diuresed prior to arrival - unclear etiology    3. Elevated troponin  4. Hx of CAD s/p CABG (2009: LIMA-LAD, SVG to D1/OM1/OM2, SVG to D2, SVG to PDA) - troponin 0.97 --> 1.32 --> 2.27 --> 2.44 - EKG with ST depression in precordial leads with Afib RVR - he has known disease and likely needs definitive angiography   5. Hypertension - home medications include BB, lisinopril - hold ACEI for AKI   6. AKI - sCr at OSH 1.51 - BMP pending   7. Leukocytosis 8. CXR with bilateral pleural effusions and suspected PNA - received 1 dose of IV rocephin prior to arrival - pt continues to have dry cough x 3 weeks   9. DM - hold glucovance - SSI    Severity of Illness: The appropriate patient status for this patient is INPATIENT. Inpatient status is judged to be reasonable and necessary in order to provide the required intensity of service to ensure the patient's safety. The patient's presenting symptoms, physical exam findings, and initial radiographic and laboratory data in the context of their chronic comorbidities is felt to place them at high risk for further clinical deterioration. Furthermore, it is not anticipated that the patient will be medically stable for discharge from the hospital within 2 midnights of admission. The following factors support the patient status of inpatient.   " The patient's presenting symptoms include chest pain, SOB, cough. " The worrisome physical exam findings include diminished lung sounds. " The initial radiographic and laboratory data are worrisome because of ST depression precordial leads. " The chronic co-morbidities include CAD,  HTN, HLD, DM.   * I certify that at the point of admission it is my clinical judgment that the  patient will require inpatient hospital care spanning beyond 2 midnights from the point of admission due to high intensity of service, high risk for further deterioration and high frequency of surveillance required.*    For questions or updates, please contact [facility] Please consult [email or web address] for contact info under   Signed, Marcelino Dusterngela Nicole Duke, [place]  07/14/2018 2:09 PM   Patient seen, examined. Available data reviewed. Agree with findings, assessment, and plan as outlined by Bettina GaviaAngie Duke, PA. On my exam today: Vitals:   07/14/18 1534 07/14/18 1536  BP:  106/67  Pulse:  (!) 104  Resp:  16  Temp: 99.3 F (37.4 C) 99.3 F (37.4 C)  SpO2:     Pt is alert and oriented, NAD HEENT: normal Neck: JVP - normal Lungs: rhonchi bilaterally CV: tachycardiac and regular without murmur or gallop Abd: soft, NT, Positive BS, no hepatomegaly Ext: no C/C/E, distal pulses intact and equal Skin: warm/dry no rash Neurology: grossly intact  I have personally reviewed the patient's records from [facility].CT abdomen shows BL pleural effusions, possible superimposed pneumonia in the bases particularly on the left. Pertinent labs: WBC 22K, 89% neutrophils. Creatinine 1.51 mg/dL. AST 173, ALT 56. Troponin trend 0.97--->2.44. BNP 1805--->6612. EKG 2/32 shows atrial flutter with 2:1 conduction with marked ST depression anteriorly.  Complicated clinical picture. The patient is normally well and has been feeling poorly for the past 3-4 weeks. He describes shortness of breath, weakness, and exertional chest pain over the past month. He complains of substernal pain with carrying or exerting himself. Symptoms resolve with rest. He presents with atrial fibrillation with RVR, acute systolic heart failure with reported LVEF 20-25%, and elevated troponin. In addition, he is noted to have leukocytosis with left shift  and questionable pneumonia on CXR and CT.  I have discussed the following treatment plan with the patient and his wife who is at the bedside:  Cover with Azithromycin and Rocephin until clinical picture clarified  Repeat CXR in AM  Repeat BMET/CBC with differential in am  Repeat 2D echo to further evaluate heart failure  Stop Xarelto (last dose yesterday) and convert to IV heparin  Favor cardiac catheterization for evaluation of NSTEMI/Demand ischemia and also hemodynamic assessment of heart failure. Reviewed risks/indications/alternatives to R/L heart catheterization with the patient who understands and agrees to proceed  Discussed his case with Dr Orvan Falconerampbell of ID. We both agree he is at low risk of Covid-19 (no fever, leukopenia, travel, sick contacts, and alternative explanation for symptoms with acute CHF and elevated BNP). However, he has recommended a respiratory and flu panel. Once negative will DC droplet precautions. Would wait on cardiac cath until patient off droplet precautions - test results should be back tomorrow.   Tonny BollmanMichael Vicy Medico, M.D. 07/14/2018 4:42 PM

## 2018-07-14 NOTE — Progress Notes (Signed)
Called to see pt re: elevated HR.  Pt had been in SR>>ST and spontaneously converted to rapid atrial fib/flutter.   HR was >150 much of the time, BP was 110s, and pt symptomatic.  He did not have CP, but was pale, had N&V, and was a little diaphoretic. Denied SOB, but resp rate was elevated.  Dr Excell Seltzer in to evaluate the patient. ECG showed deep ST depression, c/w his ECG when previously in rapid Afib/flutter.   Plan: Tx ICU, start amio IV w/ bolus, follow BP/HR If pt BP drops, may need DCCV, but hopefully will spontaneously convert to SR and stabilize.  Pt will need PICC line.   Theodore Demark, PA-C 07/14/2018 6:46 PM Beeper [phone]

## 2018-07-14 NOTE — Progress Notes (Signed)
ANTICOAGULATION CONSULT NOTE - Initial Consult  Pharmacy Consult for heparin Indication: chest pain/ACS and atrial fibrillation  Allergies  Allergen Reactions  . Furosemide Nausea Only    Patient Measurements: Height: 5\' 5"  (165.1 cm) Weight: 154 lb 8.7 oz (70.1 kg) IBW/kg (Calculated) : 61.5 Heparin Dosing Weight: 70kg  Vital Signs: Temp: 97.7 F (36.5 C) (03/24 1410) Temp Source: Oral (03/24 1410) BP: 101/70 (03/24 1410) Pulse Rate: 106 (03/24 1410)  Labs: No results for input(s): HGB, HCT, PLT, APTT, LABPROT, INR, HEPARINUNFRC, HEPRLOWMOCWT, CREATININE, CKTOTAL, CKMB, TROPONINI in the last 72 hours.  CrCl cannot be calculated (Patient's most recent lab result is older than the maximum 21 days allowed.).   Medical History: Past Medical History:  Diagnosis Date  . Coronary atherosclerosis of native coronary artery    Multivessel, LVEF 60%  . Essential hypertension, benign   . Hyperlipidemia   . Type 2 diabetes mellitus [facility])    Assessment: 63 year old male transferred from [facility] for cardiology workup. Presented to [facility] for afib with rvr and underwent DCCV and then was later placed on xarelto. Will hold xarelto for now and transition to heparin with possible plans for cath.   Per OSH EMR, xarelto given 3/23 ~1800, will start IV heparin now.   Goal of Therapy:  Heparin level 0.3-0.7 units/ml aPTT 66-102 seconds Monitor platelets by anticoagulation protocol: Yes   Plan:  Will hold off on bolus given recent xarelto Start heparin infusion at 1000 units/hr Check anti-Xa level in 6 hours and daily while on heparin, will add on aptt checks given recent xarelto Continue to monitor H&H and platelets  Sheppard Coil PharmD., BCPS Clinical Pharmacist 07/14/2018 3:29 PM

## 2018-07-15 ENCOUNTER — Inpatient Hospital Stay (HOSPITAL_COMMUNITY): Payer: PRIVATE HEALTH INSURANCE

## 2018-07-15 ENCOUNTER — Encounter (HOSPITAL_COMMUNITY)
Admission: AD | Disposition: A | Discharge status: Home / Self Care | Payer: Self-pay | Attending: Cardiovascular Disease

## 2018-07-15 LAB — HEPATIC FUNCTION PANEL
ALBUMIN: 2.9 g/dL — AB (ref 3.5–5.0)
ALK PHOS: 59 U/L (ref 38–126)
ALT: 46 U/L — ABNORMAL HIGH (ref 0–44)
AST: 86 U/L — ABNORMAL HIGH (ref 15–41)
BILIRUBIN TOTAL: 2.4 mg/dL — AB (ref 0.3–1.2)
Bilirubin, Direct: 0.8 mg/dL — ABNORMAL HIGH (ref 0.0–0.2)
Indirect Bilirubin: 1.6 mg/dL — ABNORMAL HIGH (ref 0.3–0.9)
Total Protein: 6.1 g/dL — ABNORMAL LOW (ref 6.5–8.1)

## 2018-07-15 LAB — CBC
HCT: 47.8 % (ref 39.0–52.0)
Hemoglobin: 15 g/dL (ref 13.0–17.0)
MCH: 28.7 pg (ref 26.0–34.0)
MCHC: 31.4 g/dL (ref 30.0–36.0)
MCV: 91.4 fL (ref 80.0–100.0)
Platelets: 331 10*3/uL (ref 150–400)
RBC: 5.23 MIL/uL (ref 4.22–5.81)
RDW: 13 % (ref 11.5–15.5)
WBC: 22.9 10*3/uL — ABNORMAL HIGH (ref 4.0–10.5)
nRBC: 0 % (ref 0.0–0.2)

## 2018-07-15 LAB — GLUCOSE, CAPILLARY
Glucose-Capillary: 332 mg/dL — ABNORMAL HIGH (ref 70–99)
Glucose-Capillary: 301 mg/dL — ABNORMAL HIGH (ref 70–99)
Glucose-Capillary: 227 mg/dL — ABNORMAL HIGH (ref 70–99)
Glucose-Capillary: 340 mg/dL — ABNORMAL HIGH (ref 70–99)

## 2018-07-15 LAB — BASIC METABOLIC PANEL
Anion gap: 23 — ABNORMAL HIGH (ref 5–15)
BUN: 41 mg/dL — ABNORMAL HIGH (ref 8–23)
CO2: 13 mmol/L — ABNORMAL LOW (ref 22–32)
Calcium: 8.4 mg/dL — ABNORMAL LOW (ref 8.9–10.3)
Chloride: 99 mmol/L (ref 98–111)
Creatinine, Ser: 1.76 mg/dL — ABNORMAL HIGH (ref 0.61–1.24)
GFR calc Af Amer: 47 mL/min — ABNORMAL LOW (ref >60)
GFR calc non Af Amer: 41 mL/min — ABNORMAL LOW (ref >60)
GLUCOSE: 347 mg/dL — AB (ref 70–99)
Potassium: 5.2 mmol/L — ABNORMAL HIGH (ref 3.5–5.1)
Sodium: 135 mmol/L (ref 135–145)

## 2018-07-15 LAB — HIV ANTIBODY (ROUTINE TESTING W REFLEX): HIV Screen 4th Generation wRfx: NONREACTIVE

## 2018-07-15 LAB — APTT
APTT: 33 s (ref 24–36)
aPTT: 33 seconds (ref 24–36)
aPTT: 56 seconds — ABNORMAL HIGH (ref 24–36)

## 2018-07-15 LAB — TSH: TSH: 1.227 u[IU]/mL (ref 0.350–4.500)

## 2018-07-15 LAB — LIPID PANEL
Cholesterol: 173 mg/dL (ref 0–200)
HDL: 51 mg/dL (ref >40)
LDL Cholesterol: 105 mg/dL — ABNORMAL HIGH (ref 0–99)
Total CHOL/HDL Ratio: 3.4 RATIO
Triglycerides: 85 mg/dL (ref <150)
VLDL: 17 mg/dL (ref 0–40)

## 2018-07-15 LAB — HEPARIN LEVEL (UNFRACTIONATED)
Heparin Unfractionated: 0.31 IU/mL (ref 0.30–0.70)
Heparin Unfractionated: 0.15 IU/mL — ABNORMAL LOW (ref 0.30–0.70)

## 2018-07-15 LAB — COOXEMETRY PANEL
Carboxyhemoglobin: 1.1 % (ref 0.5–1.5)
Methemoglobin: 1.2 % (ref 0.0–1.5)
O2 SAT: 52.3 %
Total hemoglobin: 14.8 g/dL (ref 12.0–16.0)

## 2018-07-15 LAB — TROPONIN I: Troponin I: 16.39 ng/mL (ref <0.03)

## 2018-07-15 SURGERY — RIGHT/LEFT HEART CATH AND CORONARY/GRAFT ANGIOGRAPHY
Anesthesia: LOCAL

## 2018-07-15 MED ORDER — SODIUM CHLORIDE 0.9% FLUSH: 10.0000 mL | INTRAVENOUS | Status: DC | PRN

## 2018-07-15 MED ORDER — ATORVASTATIN CALCIUM 80 MG PO TABS
80.0000 mg | ORAL_TABLET | Freq: Every evening | ORAL | Status: DC
  Administered 2018-07-15 – 2018-07-17 (×3): 80 mg via ORAL
  Filled 2018-07-15 (×3): qty 1

## 2018-07-15 MED ORDER — AMIODARONE LOAD VIA INFUSION
150.0000 mg | Freq: Once | INTRAVENOUS | Status: AC
  Administered 2018-07-15: 150 mg via INTRAVENOUS
  Filled 2018-07-15: qty 83.34

## 2018-07-15 MED ORDER — DIGOXIN 125 MCG PO TABS
0.1250 mg | ORAL_TABLET | Freq: Every day | ORAL | Status: DC
  Administered 2018-07-15 – 2018-07-18 (×4): 0.125 mg via ORAL
  Filled 2018-07-15 (×4): qty 1

## 2018-07-15 MED ORDER — INSULIN GLARGINE 100 UNIT/ML ~~LOC~~ SOLN
15.0000 [IU] | Freq: Every day | SUBCUTANEOUS | Status: DC
  Administered 2018-07-15 – 2018-07-16 (×2): 15 [IU] via SUBCUTANEOUS
  Filled 2018-07-15 (×2): qty 0.15

## 2018-07-15 MED ORDER — AMIODARONE IV BOLUS ONLY 150 MG/100ML
150.0000 mg | Freq: Once | INTRAVENOUS | Status: AC
  Administered 2018-07-15: 150 mg via INTRAVENOUS
  Filled 2018-07-15: qty 100

## 2018-07-15 MED ORDER — SODIUM CHLORIDE 0.9% FLUSH
10.0000 mL | Freq: Two times a day (BID) | INTRAVENOUS | Status: DC
  Administered 2018-07-15 – 2018-07-18 (×5): 10 mL

## 2018-07-15 MED ORDER — CHLORHEXIDINE GLUCONATE CLOTH 2 % EX PADS
6.0000 | MEDICATED_PAD | Freq: Every day | CUTANEOUS | Status: DC
  Administered 2018-07-15: 6 via TOPICAL

## 2018-07-15 MED ORDER — INSULIN ASPART 100 UNIT/ML ~~LOC~~ SOLN
0.0000 [IU] | SUBCUTANEOUS | Status: DC
  Administered 2018-07-15: 11 [IU] via SUBCUTANEOUS
  Administered 2018-07-15: 5 [IU] via SUBCUTANEOUS
  Administered 2018-07-16: 11 [IU] via SUBCUTANEOUS
  Administered 2018-07-16 (×2): 8 [IU] via SUBCUTANEOUS
  Administered 2018-07-16 (×2): 3 [IU] via SUBCUTANEOUS
  Administered 2018-07-16: 5 [IU] via SUBCUTANEOUS
  Administered 2018-07-17 (×2): 3 [IU] via SUBCUTANEOUS

## 2018-07-15 NOTE — Progress Notes (Signed)
ANTICOAGULATION CONSULT NOTE   Pharmacy Consult for heparin Indication: chest pain/ACS and atrial fibrillation  Allergies  Allergen Reactions  . Furosemide Nausea Only    Patient Measurements: Height: 5\' 5"  (165.1 cm) Weight: 155 lb 10.3 oz (70.6 kg) IBW/kg (Calculated) : 61.5 Heparin Dosing Weight: 70kg  Vital Signs: Temp: 98.1 F (36.7 C) (03/25 2024) Temp Source: Oral (03/25 2024) BP: 110/74 (03/25 2330) Pulse Rate: 91 (03/25 2330)  Labs: Recent Labs    07/14/18 1606  07/14/18 1631 07/14/18 2127 07/15/18 0037 07/15/18 0355 07/15/18 1057 07/15/18 2140  HGB 15.2  --   --   --   --  15.0  --   --   HCT 47.3  --   --   --   --  47.8  --   --   PLT 323  --   --   --   --  331  --   --   APTT  --    < > 31  --  33  --  33 56*  HEPARINUNFRC  --   --  0.59  --  0.31  --  0.15*  --   CREATININE 1.53*  --   --   --   --  1.76*  --   --   TROPONINI 22.10*  --   --  18.09*  --  16.39*  --   --    < > = values in this interval not displayed.    Estimated Creatinine Clearance: 38.3 mL/min (A) (by C-G formula based on SCr of 1.76 mg/dL (H)).  Assessment: 63 y.o. male with Afib, Xarelto on hold, for heparin  Goal of Therapy:  Heparin level 0.3-0.7 units/ml aPTT 66-102 seconds Monitor platelets by anticoagulation protocol: Yes   Plan:  Increase Heparin 1700 units/hr Follow-up am labs.  Geannie Risen, PharmD, BCPS  07/15/2018

## 2018-07-15 NOTE — Progress Notes (Signed)
ANTICOAGULATION CONSULT NOTE - Follow-Up Consult  Pharmacy Consult for heparin Indication: chest pain/ACS and atrial fibrillation  Allergies  Allergen Reactions  . Furosemide Nausea Only    Patient Measurements: Height: 5\' 5"  (165.1 cm) Weight: 155 lb 10.3 oz (70.6 kg) IBW/kg (Calculated) : 61.5 Heparin Dosing Weight: 70kg  Vital Signs: Temp: 97.8 F (36.6 C) (03/25 1126) Temp Source: Oral (03/25 1126) BP: 119/73 (03/25 1016) Pulse Rate: 92 (03/25 1022)  Labs: Recent Labs    07/14/18 1606 07/14/18 1631 07/14/18 2127 07/15/18 0037 07/15/18 0355 07/15/18 1057  HGB 15.2  --   --   --  15.0  --   HCT 47.3  --   --   --  47.8  --   PLT 323  --   --   --  331  --   APTT  --  31  --  33  --  33  HEPARINUNFRC  --  0.59  --  0.31  --  0.15*  CREATININE 1.53*  --   --   --  1.76*  --   TROPONINI 22.10*  --  18.09*  --  16.39*  --     Estimated Creatinine Clearance: 38.3 mL/min (A) (by C-G formula based on SCr of 1.76 mg/dL (H)).   Medical History: Past Medical History:  Diagnosis Date  . Coronary atherosclerosis of native coronary artery    Multivessel, LVEF 60%  . Essential hypertension, benign   . Hyperlipidemia   . Type 2 diabetes mellitus (HCC)    Assessment: 39 yoM transferred from OSH after presenting with AFib RVR s/p DCCV. Pt started on Xarelto but has not been switched to IV heparin with plans for cath to rule out ischemia. Last dose of Xarelto was 3/23 pm, appears to be affecting heparin levels slightly, aPTT still subtherapeutic at 33 seconds. No issues with infusion per RN.  Goal of Therapy:  Heparin level 0.3-0.7 units/ml aPTT 66-102 seconds Monitor platelets by anticoagulation protocol: Yes   Plan:  -Increase heparin to 1550 units/hr -Will recheck aPTT in 8 hr   Fredonia Highland, PharmD, BCPS Clinical Pharmacist [phone] Please check AMION for all [facility] numbers 07/15/2018

## 2018-07-15 NOTE — Progress Notes (Signed)
ANTICOAGULATION CONSULT NOTE - Follow Up Consult  Pharmacy Consult for heparin Indication: Afib and r/o ACS  Labs: Recent Labs    07/14/18 1606 07/14/18 1631 07/14/18 2127 07/15/18 0037  HGB 15.2  --   --   --   HCT 47.3  --   --   --   PLT 323  --   --   --   APTT  --  31  --  33  HEPARINUNFRC  --  0.59  --  0.31  CREATININE 1.53*  --   --   --   TROPONINI 22.10*  --  18.09*  --     Assessment: 63yo male subtherapeutic on heparin with initial dosing while Xarelto on hold; no gtt issues or signs of bleeding per RN.  Goal of Therapy:  aPTT 66-102 seconds   Plan:  Will increase heparin gtt by 4 units/kg/hr to 1300 units/hr and check PTT in 8 hours.    Vernard Gambles, PharmD, BCPS  07/15/2018,1:27 AM

## 2018-07-15 NOTE — Progress Notes (Signed)
Peripherally Inserted Central Catheter/Midline Placement  The IV Nurse has discussed with the patient and/or persons authorized to consent for the patient, the purpose of this procedure and the potential benefits and risks involved with this procedure.  The benefits include less needle sticks, lab draws from the catheter, and the patient may be discharged home with the catheter. Risks include, but not limited to, infection, bleeding, blood clot (thrombus formation), and puncture of an artery; nerve damage and irregular heartbeat and possibility to perform a PICC exchange if needed/ordered by physician.  Alternatives to this procedure were also discussed.  Bard Power PICC patient education guide, fact sheet on infection prevention and patient information card has been provided to patient /or left at bedside.    PICC/Midline Placement Documentation  PICC Double Lumen 07/15/18 PICC Right Basilic 41 cm (Active)  Indication for Insertion or Continuance of Line Prolonged intravenous therapies 07/15/2018 12:00 PM  Exposed Catheter (cm) 0 cm 07/15/2018  8:41 AM  Site Assessment Clean;Dry;Intact 07/15/2018 12:00 PM  Lumen #1 Status Flushed;Blood return noted;Saline locked 07/15/2018  8:41 AM  Lumen #2 Status Flushed;Blood return noted;Saline locked 07/15/2018  8:41 AM  Dressing Type Transparent;Securing device 07/15/2018 12:00 PM  Dressing Status Clean;Dry;Intact;Antimicrobial disc in place 07/15/2018 12:00 PM  Dressing Change Due 07/22/18 07/15/2018 12:00 PM       Romie Jumper 07/15/2018, 2:04 PM

## 2018-07-15 NOTE — Progress Notes (Addendum)
Progress Note  Patient Name: Kyle GashJimmy M Church Date of Encounter: 07/15/2018  Primary Cardiologist: Nona DellSamuel McDowell, MD   Subjective   Remains on IV amio. In/out NSR/AF.  Denies CP or SOB. + Palpitations   Having CXR done post PICC placement    Inpatient Medications    Scheduled Meds: . aspirin EC  81 mg Oral Daily  . atorvastatin  20 mg Oral QPM  . azithromycin  500 mg Oral q1800  . insulin aspart  0-15 Units Subcutaneous TID WC  . loratadine  10 mg Oral Daily  . metoprolol tartrate  25 mg Oral BID  . pantoprazole  40 mg Oral Daily  . sodium chloride flush  3 mL Intravenous Q12H   Continuous Infusions: . sodium chloride    . sodium chloride 10 mL/hr at 07/15/18 0700  . amiodarone 30 mg/hr (07/15/18 0700)  . cefTRIAXone (ROCEPHIN)  IV Stopped (07/14/18 1820)  . heparin 1,300 Units/hr (07/15/18 0700)   PRN Meds: sodium chloride, acetaminophen, nitroGLYCERIN, ondansetron (ZOFRAN) IV, sodium chloride flush   Vital Signs    Vitals:   07/15/18 0700 07/15/18 0715 07/15/18 0730 07/15/18 0745  BP: 113/75 114/72 117/71 (!) 114/95  Pulse: (!) 136 (!) 106 93 91  Resp: (!) 32 (!) 23 (!) 29 (!) 32  Temp:      TempSrc:      SpO2: 98% 100% 100% 100%  Weight:      Height:        Intake/Output Summary (Last 24 hours) at 07/15/2018 0815 Last data filed at 07/15/2018 0700 Gross per 24 hour  Intake 902.76 ml  Output 1550 ml  Net -647.24 ml   Last 3 Weights 07/15/2018 07/14/2018 10/31/2017  Weight (lbs) 155 lb 10.3 oz 154 lb 8.7 oz 156 lb  Weight (kg) 70.6 kg 70.1 kg 70.761 kg      Telemetry    A flutter - Personally Reviewed  ECG    SR with ST depression on Ant lat leads at 7. - Personally Reviewed  Physical Exam   General:  Sitting up in bed. No resp difficulty HEENT: normal Neck: supple. JVP 8-9. Carotids 2+ bilat; no bruits. No lymphadenopathy or thryomegaly appreciated. Cor: PMI nondisplaced. Irregular rate & rhythm. No rubs, gallops or murmurs. Lungs: mild  rhonchi Abdomen: soft, nontender, nondistended. No hepatosplenomegaly. No bruits or masses. Good bowel sounds. Extremities: no cyanosis, clubbing, rash, edema RUE PICC Neuro: alert & orientedx3, cranial nerves grossly intact. moves all 4 extremities w/o difficulty. Affect pleasant   Labs    Chemistry Recent Labs  Lab 07/14/18 1606 07/15/18 0355  NA 136 135  K 4.7 5.2*  CL 104 99  CO2 19* 13*  GLUCOSE 316* 347*  BUN 34* 41*  CREATININE 1.53* 1.76*  CALCIUM 8.3* 8.4*  PROT 6.8  --   ALBUMIN 3.3*  --   AST 125*  --   ALT 55*  --   ALKPHOS 63  --   BILITOT 1.1  --   GFRNONAA 48* 41*  GFRAA 56* 47*  ANIONGAP 13 23*     Hematology Recent Labs  Lab 07/14/18 1606 07/15/18 0355  WBC 19.1* 22.9*  RBC 5.25 5.23  HGB 15.2 15.0  HCT 47.3 47.8  MCV 90.1 91.4  MCH 29.0 28.7  MCHC 32.1 31.4  RDW 13.0 13.0  PLT 323 331    Cardiac Enzymes Recent Labs  Lab 07/14/18 1606 07/14/18 2127 07/15/18 0355  TROPONINI 22.10* 18.09* 16.39*   No results for input(s):  TROPIPOC in the last 168 hours.   BNP Recent Labs  Lab 07/14/18 1606  BNP 1,618.1*     DDimer No results for input(s): DDIMER in the last 168 hours.   Radiology    Dg Chest [place] 1v Same Day  Result Date: 07/15/2018 CLINICAL DATA:  Chest pain.  Cardiac patient. EXAM: PORTABLE CHEST 1 VIEW COMPARISON:  07/14/2018; 07/13/2018 FINDINGS: Grossly unchanged enlarged cardiac silhouette and mediastinal contours post median sternotomy and CABG. Pulmonary vasculature appears less distinct on the present examination with cephalization of flow. Suspected slight increase in small layering bilateral effusions with associated worsening bibasilar heterogeneous/consolidative opacities, left greater than right. No pneumothorax. No acute osseous abnormalities. IMPRESSION: Suspected worsening pulmonary edema, now with small bilateral effusions and associated bibasilar opacities, left greater than right, atelectasis versus infiltrate.  Electronically Signed   By: Simonne Come M.D.   On: 07/15/2018 07:46   [place] Ekg Site Rite  Result Date: 07/14/2018 If Site Rite image not attached, placement could not be confirmed due to current cardiac rhythm.  [place] Ekg Site Rite  Result Date: 07/14/2018 If [facility] image not attached, placement could not be confirmed due to current cardiac rhythm.   Cardiac Studies   Echo 07/14/18:  Dilated left ventricle - moderate  Severely decreased left ventricular systolic function, ejection fraction  23%  Segmental wall motion abnormality - (anterolateral, inferior and  inferolateral)  Diastolic dysfunction - grade I (normal filling pressures)  Normal right ventricular systolic function  Aortic sclerosis  Mitral regurgitation - mild   Patient Profile     63 y.o. 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  dilt drip and DCCV , elevated troponins to 2.44.  EF to 20-25%, CXR with lt pl effusion and consolidation suggesting PNA.  IV rocephin given. Neg CT abd.  WBC 22, elevated LFTs, Pro BNP 6612, Cr 1.51 --anticoagulated with xarelto.    Pt with dry cough for last 3 weeks and exertional chest pain.  His father had been ill for over 1 week and pt was notified of his father's death in transit to [facility].   Assessment & Plan    A fib with RVR - s/p TEE DCCV, on xarelto.  Now held and on Heparin. --back to rapid a flutter last pm out at 3 AM and again at 7 AM.  Now a flutter --IV amio, at 30 --rec'd one dose of IV dig yesterday  NSTEMI with troponin pk of 22 now decreasing to 16.39 --EKG with ST depression in Ant lat leads --known CAD  --on ASA, statin, BB   --mild upper chest pain  Cardiomyopathy with new acute systolic HF --EF 16-96% --CXR with increased pulmonary edema --BNP 1618  --allergy to lasix with nausea no sulfa allergy Add IV lasix?  Will defer to Dr.  Gala Romney ? IV bumex  Resp elevated  Leukocytosis --WBC 19.1 yesterday  And 22.9 today --N&V on original admit neg CT abd.  --neg influenza A &B  --Resp panel neg --on Azithromycin and Rocephin  Per Dr. Earmon Phoenix note "Discussed his case with Dr Orvan Falconer of ID. We both agree he is at low risk of Covid-19 (no fever, leukopenia, travel, sick contacts, and alternative explanation for symptoms with acute CHF and elevated BNP). However, he has recommended a respiratory and flu panel. Once negative will DC droplet precautions. Would wait  on cardiac cath until patient off droplet precautions - test results should be back tomorrow. " --? Stop droplet precautions  Hx of CAD with CABG 2009 (LIMA-LAD, SVG to D1/OM1/OM2, SVG to D2, SVG to PDA) --no cath since CABG  IV access- PICC line today  HTN BP soft 91/69 to 114/95 HR driven, increased HR lower BP  AKI --Cr 1.51 now at 1.76   Hyperkalemia at 5.2   DM-2  SSI --glucose 278 to 332 place on moderate SSI and ask diabetic coordinator to assist. ? [facility] to treat DM  HLD with LDL 105 on lipitor 20 increase to 80 mg    For questions or updates, please contact [facility] Please consult [email or web address] for contact info under        Signed, Nada Boozer, NP  07/15/2018, 8:15 AM    Patient seen and examined with the above-signed Advanced Practice Provider and/or Housestaff. I personally reviewed laboratory data, imaging studies and relevant notes. I independently examined the patient and formulated the important aspects of the plan. I have edited the note to reflect any of my changes or salient points. I have personally discussed the plan with the patient and/or family.  He remains tenuous. Now in/out of AF. Will continue IV amio with additional boluses today. Continue heparin. Discussed dosing with PharmD personally.  Currently no CP and troponin trending down so no urgent need for cath. Echo suggestive of extensive inferolateral infarct. Will  proceed with cath later in the week once creatinine improves and he is a bit more stable.   Will check CVP and co-ox. I don't think he will need inotropes at this point.   Continue ASA and statin. No ACE, ARB/ARNI or spiro just yet with AKI. Can add digoxin if co-ox low.   Keep in ICU today.   Flu swab negative.   Arvilla Meres, MD  12:18 PM

## 2018-07-15 NOTE — Progress Notes (Signed)
Inpatient Diabetes Program Recommendations  AACE/ADA: New Consensus Statement on Inpatient Glycemic Control (2015)  Target Ranges:  Prepandial:   less than 140 mg/dL      Peak postprandial:   less than 180 mg/dL (1-2 hours)      Critically ill patients:  140 - 180 mg/dL   Lab Results  Component Value Date   GLUCAP 332 (H) 07/15/2018   HGBA1C 7.5 (H) 07/14/2018    Review of Glycemic Control Results for MACIO, EBANKS (MRN [id number]) as of 07/15/2018 11:40  Ref. Range 07/14/2018 16:57 07/14/2018 21:45 07/15/2018 07:06  Glucose-Capillary Latest Ref Range: 70 - 99 mg/dL 409 (H) 811 (H) 914 (H)   Diabetes history: DM 2 Outpatient Diabetes medications:  Glucovance 5-500 mg bid, Invokana 100 mg daily Current orders for Inpatient glycemic control:  Novolog moderate tid with meals Inpatient Diabetes Program Recommendations:     Note patient is NPO.  Please increase Novolog correction to q 4 hours.  Also please add Lantus 15 units daily while patient is in the hospital.   Thanks,  Beryl Meager, RN, BC-ADM Inpatient Diabetes Coordinator Pager [phone] (8a-5p)

## 2018-07-16 DIAGNOSIS — I480 Paroxysmal atrial fibrillation: Secondary | ICD-10-CM

## 2018-07-16 LAB — GLUCOSE, CAPILLARY
Glucose-Capillary: 157 mg/dL — ABNORMAL HIGH (ref 70–99)
Glucose-Capillary: 164 mg/dL — ABNORMAL HIGH (ref 70–99)
Glucose-Capillary: 258 mg/dL — ABNORMAL HIGH (ref 70–99)
Glucose-Capillary: 263 mg/dL — ABNORMAL HIGH (ref 70–99)
Glucose-Capillary: 239 mg/dL — ABNORMAL HIGH (ref 70–99)
Glucose-Capillary: 317 mg/dL — ABNORMAL HIGH (ref 70–99)

## 2018-07-16 LAB — CBC
HEMATOCRIT: 39.6 % (ref 39.0–52.0)
Hemoglobin: 12.9 g/dL — ABNORMAL LOW (ref 13.0–17.0)
MCH: 29.1 pg (ref 26.0–34.0)
MCHC: 32.6 g/dL (ref 30.0–36.0)
MCV: 89.4 fL (ref 80.0–100.0)
Platelets: 238 10*3/uL (ref 150–400)
RBC: 4.43 MIL/uL (ref 4.22–5.81)
RDW: 13.2 % (ref 11.5–15.5)
WBC: 12.8 10*3/uL — ABNORMAL HIGH (ref 4.0–10.5)
nRBC: 0 % (ref 0.0–0.2)

## 2018-07-16 LAB — BASIC METABOLIC PANEL
Anion gap: 14 (ref 5–15)
BUN: 31 mg/dL — ABNORMAL HIGH (ref 8–23)
CO2: 21 mmol/L — ABNORMAL LOW (ref 22–32)
Calcium: 7.6 mg/dL — ABNORMAL LOW (ref 8.9–10.3)
Chloride: 97 mmol/L — ABNORMAL LOW (ref 98–111)
Creatinine, Ser: 1.11 mg/dL (ref 0.61–1.24)
GFR calc Af Amer: >60 mL/min (ref >60)
GFR calc non Af Amer: >60 mL/min (ref >60)
Glucose, Bld: 280 mg/dL — ABNORMAL HIGH (ref 70–99)
Potassium: 4.4 mmol/L (ref 3.5–5.1)
Sodium: 132 mmol/L — ABNORMAL LOW (ref 135–145)

## 2018-07-16 LAB — COOXEMETRY PANEL
Carboxyhemoglobin: 1.1 % (ref 0.5–1.5)
Methemoglobin: 1.7 % — ABNORMAL HIGH (ref 0.0–1.5)
O2 Saturation: 52.9 %
Total hemoglobin: 13.3 g/dL (ref 12.0–16.0)

## 2018-07-16 LAB — APTT: aPTT: 73 seconds — ABNORMAL HIGH (ref 24–36)

## 2018-07-16 LAB — HEPARIN LEVEL (UNFRACTIONATED)
Heparin Unfractionated: 0.52 IU/mL (ref 0.30–0.70)
Heparin Unfractionated: 0.46 IU/mL (ref 0.30–0.70)

## 2018-07-16 MED ORDER — SODIUM CHLORIDE 0.9 % IV SOLN: 250.0000 mL | INTRAVENOUS | Status: DC | PRN

## 2018-07-16 MED ORDER — AMIODARONE LOAD VIA INFUSION
150.0000 mg | Freq: Once | INTRAVENOUS | Status: AC
  Administered 2018-07-16: 150 mg via INTRAVENOUS
  Filled 2018-07-16: qty 83.34

## 2018-07-16 MED ORDER — AMIODARONE IV BOLUS ONLY 150 MG/100ML: 150.0000 mg | Freq: Once | INTRAVENOUS | Status: DC

## 2018-07-16 MED ORDER — SPIRONOLACTONE 12.5 MG HALF TABLET
12.5000 mg | ORAL_TABLET | Freq: Every day | ORAL | Status: DC
  Administered 2018-07-16 – 2018-07-18 (×3): 12.5 mg via ORAL
  Filled 2018-07-16 (×3): qty 1

## 2018-07-16 MED ORDER — RANOLAZINE ER 500 MG PO TB12
500.0000 mg | ORAL_TABLET | Freq: Two times a day (BID) | ORAL | Status: DC
  Administered 2018-07-16 – 2018-07-18 (×4): 500 mg via ORAL
  Filled 2018-07-16 (×4): qty 1

## 2018-07-16 MED ORDER — INSULIN GLARGINE 100 UNIT/ML ~~LOC~~ SOLN
20.0000 [IU] | Freq: Every day | SUBCUTANEOUS | Status: DC
  Administered 2018-07-17: 20 [IU] via SUBCUTANEOUS
  Filled 2018-07-16 (×2): qty 0.2

## 2018-07-16 MED ORDER — SODIUM CHLORIDE 0.9 % WEIGHT BASED INFUSION
1.0000 mL/kg/h | INTRAVENOUS | Status: DC
  Administered 2018-07-17: 1 mL/kg/h via INTRAVENOUS

## 2018-07-16 MED ORDER — SODIUM CHLORIDE 0.9% FLUSH: 3.0000 mL | INTRAVENOUS | Status: DC | PRN

## 2018-07-16 MED ORDER — ASPIRIN 81 MG PO CHEW
81.0000 mg | CHEWABLE_TABLET | ORAL | Status: AC
  Administered 2018-07-17: 81 mg via ORAL
  Filled 2018-07-16: qty 1

## 2018-07-16 MED ORDER — GUAIFENESIN-DM 100-10 MG/5ML PO SYRP
5.0000 mL | ORAL_SOLUTION | ORAL | Status: DC | PRN
  Administered 2018-07-16 – 2018-07-17 (×3): 5 mL via ORAL
  Filled 2018-07-16 (×2): qty 5

## 2018-07-16 MED ORDER — SODIUM CHLORIDE 0.9% FLUSH
3.0000 mL | Freq: Two times a day (BID) | INTRAVENOUS | Status: DC
  Administered 2018-07-16: 3 mL via INTRAVENOUS

## 2018-07-16 MED ORDER — INSULIN GLARGINE 100 UNIT/ML ~~LOC~~ SOLN
5.0000 [IU] | Freq: Once | SUBCUTANEOUS | Status: AC
  Administered 2018-07-16: 5 [IU] via SUBCUTANEOUS
  Filled 2018-07-16: qty 0.05

## 2018-07-16 NOTE — Progress Notes (Signed)
ANTICOAGULATION CONSULT NOTE - Follow-Up Consult  Pharmacy Consult for heparin Indication: chest pain/ACS and atrial fibrillation  Allergies  Allergen Reactions  . Furosemide Nausea Only    Patient Measurements: Height: 5\' 5"  (165.1 cm) Weight: 157 lb 13.6 oz (71.6 kg) IBW/kg (Calculated) : 61.5 Heparin Dosing Weight: 70kg  Vital Signs: Temp: 99.1 F (37.3 C) (03/26 1609) Temp Source: Oral (03/26 1609) BP: 109/78 (03/26 1633) Pulse Rate: 138 (03/26 1633)  Labs: Recent Labs    07/14/18 1606  07/14/18 2127  07/15/18 0355 07/15/18 1057 07/15/18 2140 07/16/18 0430 07/16/18 0924 07/16/18 1548  HGB 15.2  --   --   --  15.0  --   --  12.9*  --   --   HCT 47.3  --   --   --  47.8  --   --  39.6  --   --   PLT 323  --   --   --  331  --   --  238  --   --   APTT  --    < >  --    < >  --  33 56*  --  73*  --   HEPARINUNFRC  --    < >  --    < >  --  0.15*  --   --  0.52 0.46  CREATININE 1.53*  --   --   --  1.76*  --   --  1.11  --   --   TROPONINI 22.10*  --  18.09*  --  16.39*  --   --   --   --   --    < > = values in this interval not displayed.    Estimated Creatinine Clearance: 60.8 mL/min (by C-G formula based on SCr of 1.11 mg/dL).   Medical History: Past Medical History:  Diagnosis Date  . Coronary atherosclerosis of native coronary artery    Multivessel, LVEF 60%  . Essential hypertension, benign   . Hyperlipidemia   . Type 2 diabetes mellitus (HCC)    Assessment: 46 yoM transferred from OSH after presenting with AFib RVR s/p DCCV. Pt started on Xarelto and now wwitched to IV heparin with plans for cath to rule out ischemia.  Heparin level remains therapeutic at 0.46 - utilizing heparin level as appears to be correlating with aPTT.    Goal of Therapy:  Heparin level 0.3-0.7 units/ml aPTT 66-102 seconds Monitor platelets by anticoagulation protocol: Yes   Plan:  -Will continue heparin at 1700 units/hr -Will monitor daily heparin level and CBC and  for s/sx of bleeding  Link Snuffer, PharmD, BCPS, BCCCP Clinical Pharmacist Please refer to [facility] for [facility] numbers 07/16/2018   5:04 PM

## 2018-07-16 NOTE — Plan of Care (Signed)
  Problem: Education: Goal: Knowledge of General Education information will improve Description: Including pain rating scale, medication(s)/side effects and non-pharmacologic comfort measures Outcome: Progressing   Problem: Health Behavior/Discharge Planning: Goal: Ability to manage health-related needs will improve Outcome: Progressing   Problem: Clinical Measurements: Goal: Ability to maintain clinical measurements within normal limits will improve Outcome: Progressing Goal: Will remain free from infection Outcome: Progressing Goal: Diagnostic test results will improve Outcome: Progressing Goal: Respiratory complications will improve Outcome: Progressing Goal: Cardiovascular complication will be avoided Outcome: Progressing   Problem: Activity: Goal: Risk for activity intolerance will decrease Outcome: Progressing   Problem: Nutrition: Goal: Adequate nutrition will be maintained Outcome: Progressing   Problem: Coping: Goal: Level of anxiety will decrease Outcome: Progressing   Problem: Elimination: Goal: Will not experience complications related to bowel motility Outcome: Progressing Goal: Will not experience complications related to urinary retention Outcome: Progressing   Problem: Pain Managment: Goal: General experience of comfort will improve Outcome: Progressing   Problem: Safety: Goal: Ability to remain free from injury will improve Outcome: Progressing   Problem: Skin Integrity: Goal: Risk for impaired skin integrity will decrease Outcome: Progressing   Problem: Education: Goal: Understanding of CV disease, CV risk reduction, and recovery process will improve Outcome: Progressing Goal: Individualized Educational Video(s) Outcome: Progressing   Problem: Activity: Goal: Ability to return to baseline activity level will improve Outcome: Progressing   Problem: Cardiovascular: Goal: Ability to achieve and maintain adequate cardiovascular perfusion  will improve Outcome: Progressing Goal: Vascular access site(s) Level 0-1 will be maintained Outcome: Progressing   Problem: Health Behavior/Discharge Planning: Goal: Ability to safely manage health-related needs after discharge will improve Outcome: Progressing   Problem: Education: Goal: Understanding of cardiac disease, CV risk reduction, and recovery process will improve Outcome: Progressing Goal: Understanding of medication regimen will improve Outcome: Progressing Goal: Individualized Educational Video(s) Outcome: Progressing   Problem: Activity: Goal: Ability to tolerate increased activity will improve Outcome: Progressing   Problem: Cardiac: Goal: Ability to achieve and maintain adequate cardiopulmonary perfusion will improve Outcome: Progressing Goal: Vascular access site(s) Level 0-1 will be maintained Outcome: Progressing   Problem: Health Behavior/Discharge Planning: Goal: Ability to safely manage health-related needs after discharge will improve Outcome: Progressing   

## 2018-07-16 NOTE — Progress Notes (Signed)
Still with heart rate 130's a-fib, denied any symptoms, bp 108/79. NP made aware with order.  Amiodarone 150 mg bolus given  . Continue to monitor.

## 2018-07-16 NOTE — Progress Notes (Signed)
Inpatient Diabetes Program Recommendations  AACE/ADA: New Consensus Statement on Inpatient Glycemic Control (2015)  Target Ranges:  Prepandial:   less than 140 mg/dL      Peak postprandial:   less than 180 mg/dL (1-2 hours)      Critically ill patients:  140 - 180 mg/dL   Lab Results  Component Value Date   GLUCAP 258 (H) 07/16/2018   HGBA1C 7.5 (H) 07/14/2018    Review of Glycemic Control Results for JEBADIAH, BOYDEN (MRN [id number]) as of 07/16/2018 09:52  Ref. Range 07/15/2018 20:24 07/16/2018 00:57 07/16/2018 03:45 07/16/2018 08:00  Glucose-Capillary Latest Ref Range: 70 - 99 mg/dL 694 (H) 854 (H) 627 (H) 258 (H)  Diabetes history: DM 2 Outpatient Diabetes medications:  Glucovance 5-500 mg bid, Invokana 100 mg daily Current orders for Inpatient glycemic control:  Novolog moderate q 4 hours, Lantus 15 units daily Inpatient Diabetes Program Recommendations:     Please consider increasing Lantus to 20 units daily.  Note patient to be NPO after MN, therefore continue q 4 hour correction until after cath.  Please also consider adding Novolog meal coverage 3 units tid with meals.    **A1C indicates fair control of diabetes prior to admit 7.5%.  May need adjustment of home oral medications for DM at d/c.  He should not need insulin at d/c based on A1C.   Thanks,  Beryl Meager, RN, BC-ADM Inpatient Diabetes Coordinator Pager [phone] (8a-5p)

## 2018-07-16 NOTE — Progress Notes (Signed)
Pt in prolonged a fib, have given 2 boluses of 150 mg amio and he is on Dig.  Discussed with Dr. Gala Romney and will add ranexa 500 mg BID.

## 2018-07-16 NOTE — Progress Notes (Signed)
ANTICOAGULATION CONSULT NOTE - Follow-Up Consult  Pharmacy Consult for heparin Indication: chest pain/ACS and atrial fibrillation  Allergies  Allergen Reactions  . Furosemide Nausea Only    Patient Measurements: Height: 5\' 5"  (165.1 cm) Weight: 157 lb 13.6 oz (71.6 kg) IBW/kg (Calculated) : 61.5 Heparin Dosing Weight: 70kg  Vital Signs: Temp: 98.7 F (37.1 C) (03/26 0748) Temp Source: Oral (03/26 0748) BP: 105/69 (03/26 0800) Pulse Rate: 92 (03/26 0800)  Labs: Recent Labs    07/14/18 1606  07/14/18 2127 07/15/18 0037 07/15/18 0355 07/15/18 1057 07/15/18 2140 07/16/18 0430 07/16/18 0924  HGB 15.2  --   --   --  15.0  --   --  12.9*  --   HCT 47.3  --   --   --  47.8  --   --  39.6  --   PLT 323  --   --   --  331  --   --  238  --   APTT  --    < >  --  33  --  33 56*  --  73*  HEPARINUNFRC  --    < >  --  0.31  --  0.15*  --   --  0.52  CREATININE 1.53*  --   --   --  1.76*  --   --  1.11  --   TROPONINI 22.10*  --  18.09*  --  16.39*  --   --   --   --    < > = values in this interval not displayed.    Estimated Creatinine Clearance: 60.8 mL/min (by C-G formula based on SCr of 1.11 mg/dL).   Medical History: Past Medical History:  Diagnosis Date  . Coronary atherosclerosis of native coronary artery    Multivessel, LVEF 60%  . Essential hypertension, benign   . Hyperlipidemia   . Type 2 diabetes mellitus (HCC)    Assessment: 9 yoM transferred from OSH after presenting with AFib RVR s/p DCCV. Pt started on Xarelto and now wwitched to IV heparin with plans for cath to rule out ischemia. Last dose of Xarelto was 3/23 pm, appears to be affecting heparin levels slightly.   aPTT levels now within range at 73 seconds, heparin level 0.52. Slight decrease in Hgb with no s/sx of bleeding noted.   Goal of Therapy:  Heparin level 0.3-0.7 units/ml aPTT 66-102 seconds Monitor platelets by anticoagulation protocol: Yes   Plan:  -Will continue heparin at 1700  units/hr -Will recheck levels at 8hrs, monitor CBCs and for s/sx of bleeding  Gwynneth Albright, Ilda Basset D PGY1 Pharmacy Resident  Phone [phone] 07/16/2018   10:07 AM

## 2018-07-16 NOTE — Progress Notes (Signed)
Transferred -in from 2H by wheelchair. 

## 2018-07-16 NOTE — H&P (View-Only) (Signed)
° °Progress Note ° °Patient Name: [name] °Date of Encounter: 07/16/2018 ° °Primary Cardiologist: Samuel McDowell, MD  ° °Subjective  ° °Feels much better.  Sitting up in bed. Hungry. No SOB, orthopnea or PND. Brief episodic chest discomfort - also with brief episodes of PAF rates in 120s. No bleeding on heparin.  ° °CVP 5 (checked personally)  Co-ox low at 53% ° °Inpatient Medications  °  °Scheduled Meds: °• aspirin EC  81 mg Oral Daily  °• atorvastatin  80 mg Oral QPM  °• azithromycin  500 mg Oral q1800  °• Chlorhexidine Gluconate Cloth  6 each Topical Daily  °• digoxin  0.125 mg Oral Daily  °• insulin aspart  0-15 Units Subcutaneous Q4H  °• insulin glargine  15 Units Subcutaneous Daily  °• loratadine  10 mg Oral Daily  °• pantoprazole  40 mg Oral Daily  °• sodium chloride flush  10-40 mL Intracatheter Q12H  °• sodium chloride flush  3 mL Intravenous Q12H  ° °Continuous Infusions: °• sodium chloride    °• sodium chloride Stopped (07/15/18 1758)  °• amiodarone 30 mg/hr (07/16/18 0600)  °• cefTRIAXone (ROCEPHIN)  IV Stopped (07/15/18 1749)  °• heparin 1,700 Units/hr (07/16/18 0643)  ° °PRN Meds: °sodium chloride, acetaminophen, nitroGLYCERIN, ondansetron (ZOFRAN) IV, sodium chloride flush, sodium chloride flush  ° °Vital Signs  °  °Vitals:  ° 07/16/18 0615 07/16/18 0630 07/16/18 0645 07/16/18 0748  °BP: 100/70 99/80 96/70   °Pulse: (!) 137 (!) 133 88   °Resp: (!) 25 (!) 26 (!) 27   °Temp:    98.7 °F (37.1 °C)  °TempSrc:    Oral  °SpO2: 96% 96% 99%   °Weight:      °Height:      ° ° °Intake/Output Summary (Last 24 hours) at 07/16/2018 0757 °Last data filed at 07/16/2018 0600 °Gross per 24 hour  °Intake 1081.82 ml  °Output 1375 ml  °Net -293.18 ml  ° °Last 3 Weights 07/16/2018 07/15/2018 07/14/2018  °Weight (lbs) 157 lb 13.6 oz 155 lb 10.3 oz 154 lb 8.7 oz  °Weight (kg) 71.6 kg 70.6 kg 70.1 kg  °   ° °Telemetry  °  °SR with PAF rates with a fib in 120s - Personally Reviewed ° °ECG  °  °SR with ST depression ant leads   - Personally Reviewed ° °Physical Exam  ° °GEN: No acute distress.  Frustrated with being here with recent death of his father °Neck: No JVD at 20 degrees °Cardiac: RRR, no murmurs, rubs, or gallops.  °Respiratory: Clear/diminished to auscultation bilaterally. °GI: Soft, nontender, non-distended  °MS: No edema; No deformity. °Neuro:  Nonfocal  °Psych: Normal affect  ° °Labs  °  °Chemistry °Recent Labs  °Lab 07/14/18 °1606 07/15/18 °0355 07/15/18 °1057 07/16/18 °0430  °NA 136 135  --  132*  °K 4.7 5.2*  --  4.4  °CL 104 99  --  97*  °CO2 19* 13*  --  21*  °GLUCOSE 316* 347*  --  280*  °BUN 34* 41*  --  31*  °CREATININE 1.53* 1.76*  --  1.11  °CALCIUM 8.3* 8.4*  --  7.6*  °PROT 6.8  --  6.1*  --   °ALBUMIN 3.3*  --  2.9*  --   °AST 125*  --  86*  --   °ALT 55*  --  46*  --   °ALKPHOS 63  --  59  --   °BILITOT 1.1  --  2.4*  --   °  GFRNONAA 48* 41*  --  >60  °GFRAA 56* 47*  --  >60  °ANIONGAP 13 23*  --  14  °  ° °Hematology °Recent Labs  °Lab 07/14/18 °1606 07/15/18 °0355 07/16/18 °0430  °WBC 19.1* 22.9* 12.8*  °RBC 5.25 5.23 4.43  °HGB 15.2 15.0 12.9*  °HCT 47.3 47.8 39.6  °MCV 90.1 91.4 89.4  °MCH 29.0 28.7 29.1  °MCHC 32.1 31.4 32.6  °RDW 13.0 13.0 13.2  °PLT 323 331 238  ° ° °Cardiac Enzymes °Recent Labs  °Lab 07/14/18 °1606 07/14/18 °2127 07/15/18 °0355  °TROPONINI 22.10* 18.09* 16.39*  ° No results for input(s): TROPIPOC in the last 168 hours.  ° °BNP °Recent Labs  °Lab 07/14/18 °1606  °BNP 1,618.1*  °  ° °DDimer No results for input(s): DDIMER in the last 168 hours.  ° °Radiology  °  °Dg Chest Port 1 View ° °Result Date: 07/15/2018 °CLINICAL DATA:  Follow-up PICC line EXAM: PORTABLE CHEST 1 VIEW COMPARISON:  07/15/2018 FINDINGS: New right-sided PICC line is noted with the catheter tip just below the cavoatrial junction. Cardiac shadow is stable. Postoperative changes are seen. Improved aeration is noted with slight decrease in the degree of vascular congestion. Left retrocardiac atelectasis is again seen.  IMPRESSION: New right-sided PICC line just below the cavoatrial junction. Persistent left basilar atelectasis. Improvement in the degree of vascular congestion and edema. Electronically Signed   By: Mark  Lukens M.D.   On: 07/15/2018 09:12  ° °Dg Chest Port 1v Same Day ° °Result Date: 07/15/2018 °CLINICAL DATA:  Chest pain.  Cardiac patient. EXAM: PORTABLE CHEST 1 VIEW COMPARISON:  07/14/2018; 07/13/2018 FINDINGS: Grossly unchanged enlarged cardiac silhouette and mediastinal contours post median sternotomy and CABG. Pulmonary vasculature appears less distinct on the present examination with cephalization of flow. Suspected slight increase in small layering bilateral effusions with associated worsening bibasilar heterogeneous/consolidative opacities, left greater than right. No pneumothorax. No acute osseous abnormalities. IMPRESSION: Suspected worsening pulmonary edema, now with small bilateral effusions and associated bibasilar opacities, left greater than right, atelectasis versus infiltrate. Electronically Signed   By: John  Watts M.D.   On: 07/15/2018 07:46  ° °Us Ekg Site Rite ° °Result Date: 07/14/2018 °If Site Rite image not attached, placement could not be confirmed due to current cardiac rhythm. ° °Us Ekg Site Rite ° °Result Date: 07/14/2018 °If Site Rite image not attached, placement could not be confirmed due to current cardiac rhythm. ° ° °Cardiac Studies  ° °Echo 07/14/18: °· Dilated left ventricle - moderate °· Severely decreased left ventricular systolic function, ejection fraction  °23% °· Segmental wall motion abnormality - (anterolateral, inferior and  °inferolateral) °· Diastolic dysfunction - grade I (normal filling pressures) °· Normal right ventricular systolic function °· Aortic sclerosis °· Mitral regurgitation - mild ° °Patient Profile  °   °63 y.o. 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  dilt drip and DCCV , elevated troponins to 2.44.  EF to 20-25%, CXR with lt pl effusion and consolidation suggesting PNA.  IV rocephin given. Neg CT abd.  WBC 22, elevated LFTs, Pro BNP 6612, Cr 1.51 --anticoagulated with xarelto.   °  °Pt with dry cough for last 3 weeks and exertional chest pain.  His father had been ill for over 1 week and pt was notified of his father's death in transit to [facility].  ° °  Assessment & Plan  °  °A fib with RVR - s/p TEE DCCV, on xarelto.  Now held and on Heparin. °--back to rapid a flutter last pm out at 3 AM and again at 7 AM.  °--IV amio, at 30 with episodic boluses of amiodarone. Mostly SR with episodes of PAF °--rec'd one dose of IV dig on the 24th and now daily. °  °NSTEMI with troponin pk of 22 now decreasing to 16.39 °--EKG with ST depression in Ant lat leads continues today  °--known CAD  °--on ASA, statin, BB   °--mild upper chest pain at times today brief, "kind of like gas" ° °The patient understands that risks included but are not limited to stroke (1 in 1000), death (1 in 1000), kidney failure [usually temporary] (1 in 500), bleeding (1 in 200), allergic reaction [possibly serious] (1 in 200).  ° ° °Cardiomyopathy with new acute systolic HF °--EF 20-25% °--CXR with increased pulmonary edema °--BNP 1618  °--allergy to lasix with nausea no sulfa allergy  °--Resp continue to be elevated  °  °Leukocytosis °--WBC 19.1 --> 22.9 --> 12.8 today °--N&V on original admit neg CT abd.  °--neg influenza A &B  °--Resp panel neg °--on Azithromycin and Rocephin  °Per Dr. Cooper's note "Discussed his case with Dr Campbell of ID. We both agree he is at low risk of Covid-19 (no fever, leukopenia, travel, sick contacts, and alternative explanation for symptoms with acute CHF and elevated BNP). However, he has recommended a respiratory and flu panel. Once negative will DC droplet precautions. Would wait on cardiac cath until patient off droplet precautions - test results should  be back tomorrow. " °--Stopped droplet precautions °  °Hx of CAD with CABG 2009 (LIMA-LAD, SVG to D1/OM1/OM2, SVG to D2, SVG to PDA) °--no cath since CABG  Cath in AM at 0730 Rt and Lt with grafts °  °IV access- PICC line placed yesterday and CVP was 4  At 0300 today at 12 °  °HTN BP soft 91/69 to 114/95 HR driven, increased HR lower BP °  °AKI °--Cr pk 1.76 now 1.11  °--give IV fluids starting at 8 pm  °  °Hyperkalemia resolved now 4.4  °  °DM-2  °SSI --glucose was 278 to 332 place on moderate SSI diabetic coordinator consulted and increased freq of SSI and added Lantus now glucose 157 to 258  ° °HLD with LDL 105 on lipitor 20 increase to 80 mg  °  °  ° °For questions or updates, please contact [facility] °Please consult [email or web address] for contact info under  ° °  °   °Signed, °Laura Ingold, NP  °07/16/2018, 7:57 AM   ° °Patient seen and examined with the above-signed Advanced Practice Provider and/or Housestaff. I personally reviewed laboratory data, imaging studies and relevant notes. I independently examined the patient and formulated the important aspects of the plan. I have edited the note to reflect any of my changes or salient points. I have personally discussed the plan with the patient and/or family. ° °He is maintaining SR for the most part on IV amio. Still with brief breakthroughs. On heparin with no bleeding. ° °Has intermittent chest discomfort but not c/w with previous angina. Likely GI.  ° °Co-ox remains low. Volume status ok. Renal function back to baseline.  ° °Will add spiro. Continue digoxin. No b-blocker yet with low output.  ° °Can move to SDU. Cath tomorrow am. Would be careful with volume loading prior to cath. Will change fluids to 3am as he is taking good po.  ° °  Truth Wolaver, MD  °9:52 AM ° ° ° °

## 2018-07-16 NOTE — Progress Notes (Signed)
Heart rate went  up to 130's , asymptomatic. NP aware , amiodarone bolus given bp-105/75. Heart rate went down to 120's continue to monitor.

## 2018-07-16 NOTE — Progress Notes (Addendum)
Progress Note  Patient Name: [name] Date of Encounter: 07/16/2018  Primary Cardiologist: [name], MD   Subjective   Feels much better.  Sitting up in bed. Hungry. No SOB, orthopnea or PND. Brief episodic chest discomfort - also with brief episodes of PAF rates in 120s. No bleeding on heparin.   CVP 5 (checked personally)  Co-ox low at 53%  Inpatient Medications    Scheduled Meds:  aspirin EC  81 mg Oral Daily   atorvastatin  80 mg Oral QPM   azithromycin  500 mg Oral q1800   Chlorhexidine Gluconate Cloth  6 each Topical Daily   digoxin  0.125 mg Oral Daily   insulin aspart  0-15 Units Subcutaneous Q4H   insulin glargine  15 Units Subcutaneous Daily   loratadine  10 mg Oral Daily   pantoprazole  40 mg Oral Daily   sodium chloride flush  10-40 mL Intracatheter Q12H   sodium chloride flush  3 mL Intravenous Q12H   Continuous Infusions:  sodium chloride     sodium chloride Stopped (07/15/18 1758)   amiodarone 30 mg/hr (07/16/18 0600)   cefTRIAXone (ROCEPHIN)  IV Stopped (07/15/18 1749)   heparin 1,700 Units/hr (07/16/18 0643)   PRN Meds: sodium chloride, acetaminophen, nitroGLYCERIN, ondansetron (ZOFRAN) IV, sodium chloride flush, sodium chloride flush   Vital Signs    Vitals:   07/16/18 0615 07/16/18 0630 07/16/18 0645 07/16/18 0748  BP: 100/70 99/80 96/70    Pulse: (!) 137 (!) 133 88   Resp: (!) 25 (!) 26 (!) 27   Temp:    98.7 F (37.1 C)  TempSrc:    Oral  SpO2: 96% 96% 99%   Weight:      Height:        Intake/Output Summary (Last 24 hours) at 07/16/2018 0757 Last data filed at 07/16/2018 0600 Gross per 24 hour  Intake 1081.82 ml  Output 1375 ml  Net -293.18 ml   Last 3 Weights 07/16/2018 07/15/2018 07/14/2018  Weight (lbs) 157 lb 13.6 oz 155 lb 10.3 oz 154 lb 8.7 oz  Weight (kg) 71.6 kg 70.6 kg 70.1 kg      Telemetry    SR with PAF rates with a fib in 120s - Personally Reviewed  ECG    SR with ST depression ant leads   - Personally Reviewed  Physical Exam   GEN: No acute distress.  Frustrated with being here with recent death of his father Neck: No JVD at 20 degrees Cardiac: RRR, no murmurs, rubs, or gallops.  Respiratory: Clear/diminished to auscultation bilaterally. GI: Soft, nontender, non-distended  MS: No edema; No deformity. Neuro:  Nonfocal  Psych: Normal affect   Labs    Chemistry Recent Labs  Lab 07/14/18 1606 07/15/18 0355 07/15/18 1057 07/16/18 0430  NA 136 135  --  132*  K 4.7 5.2*  --  4.4  CL 104 99  --  97*  CO2 19* 13*  --  21*  GLUCOSE 316* 347*  --  280*  BUN 34* 41*  --  31*  CREATININE 1.53* 1.76*  --  1.11  CALCIUM 8.3* 8.4*  --  7.6*  PROT 6.8  --  6.1*  --   ALBUMIN 3.3*  --  2.9*  --   AST 125*  --  86*  --   ALT 55*  --  46*  --   ALKPHOS 63  --  59  --   BILITOT 1.1  --  2.4*  --  GFRNONAA 48* 41*  --  >60  GFRAA 56* 47*  --  >60  ANIONGAP 13 23*  --  14     Hematology Recent Labs  Lab 07/14/18 1606 07/15/18 0355 07/16/18 0430  WBC 19.1* 22.9* 12.8*  RBC 5.25 5.23 4.43  HGB 15.2 15.0 12.9*  HCT 47.3 47.8 39.6  MCV 90.1 91.4 89.4  MCH 29.0 28.7 29.1  MCHC 32.1 31.4 32.6  RDW 13.0 13.0 13.2  PLT 323 331 238    Cardiac Enzymes Recent Labs  Lab 07/14/18 1606 07/14/18 2127 07/15/18 0355  TROPONINI 22.10* 18.09* 16.39*   No results for input(s): TROPIPOC in the last 168 hours.   BNP Recent Labs  Lab 07/14/18 1606  BNP 1,618.1*     DDimer No results for input(s): DDIMER in the last 168 hours.   Radiology    Dg Chest Port 1 View  Result Date: 07/15/2018 CLINICAL DATA:  Follow-up PICC line EXAM: PORTABLE CHEST 1 VIEW COMPARISON:  07/15/2018 FINDINGS: New right-sided PICC line is noted with the catheter tip just below the cavoatrial junction. Cardiac shadow is stable. Postoperative changes are seen. Improved aeration is noted with slight decrease in the degree of vascular congestion. Left retrocardiac atelectasis is again seen.  IMPRESSION: New right-sided PICC line just below the cavoatrial junction. Persistent left basilar atelectasis. Improvement in the degree of vascular congestion and edema. Electronically Signed   By: Alcide Clever M.D.   On: 07/15/2018 09:12   Dg Chest Port 1v Same Day  Result Date: 07/15/2018 CLINICAL DATA:  Chest pain.  Cardiac patient. EXAM: PORTABLE CHEST 1 VIEW COMPARISON:  07/14/2018; 07/13/2018 FINDINGS: Grossly unchanged enlarged cardiac silhouette and mediastinal contours post median sternotomy and CABG. Pulmonary vasculature appears less distinct on the present examination with cephalization of flow. Suspected slight increase in small layering bilateral effusions with associated worsening bibasilar heterogeneous/consolidative opacities, left greater than right. No pneumothorax. No acute osseous abnormalities. IMPRESSION: Suspected worsening pulmonary edema, now with small bilateral effusions and associated bibasilar opacities, left greater than right, atelectasis versus infiltrate. Electronically Signed   By: Simonne Come M.D.   On: 07/15/2018 07:46   [place] Ekg Site Rite  Result Date: 07/14/2018 If Site Rite image not attached, placement could not be confirmed due to current cardiac rhythm.  [place] Ekg Site Rite  Result Date: 07/14/2018 If [facility] image not attached, placement could not be confirmed due to current cardiac rhythm.   Cardiac Studies   Echo 07/14/18:  Dilated left ventricle - moderate  Severely decreased left ventricular systolic function, ejection fraction  23%  Segmental wall motion abnormality - (anterolateral, inferior and  inferolateral)  Diastolic dysfunction - grade I (normal filling pressures)  Normal right ventricular systolic function  Aortic sclerosis  Mitral regurgitation - mild  Patient Profile     63 y.o. male with CAD s/p CABG (12/2007, LIMA-LAD, SVG to D1/OM1/OM2, SVG to D2, SVG to PDA), HTN, HLD, and DM2 presented to [facility] with N &V and in  a fib with RVR.  dilt drip and DCCV , elevated troponins to 2.44.  EF to 20-25%, CXR with lt pl effusion and consolidation suggesting PNA.  IV rocephin given. Neg CT abd.  WBC 22, elevated LFTs, Pro BNP 6612, Cr 1.51 --anticoagulated with xarelto.    Pt with dry cough for last 3 weeks and exertional chest pain.  His father had been ill for over 1 week and pt was notified of his father's death in transit to [facility].  Assessment & Plan    A fib with RVR - s/p TEE DCCV, on xarelto.  Now held and on Heparin. --back to rapid a flutter last pm out at 3 AM and again at 7 AM.  --IV amio, at 30 with episodic boluses of amiodarone. Mostly SR with episodes of PAF --rec'd one dose of IV dig on the 24th and now daily.  NSTEMI with troponin pk of 22 now decreasing to 16.39 --EKG with ST depression in Ant lat leads continues today  --known CAD  --on ASA, statin, BB   --mild upper chest pain at times today brief, "kind of like gas"  The patient understands that risks included but are not limited to stroke (1 in 1000), death (1 in 1000), kidney failure [usually temporary] (1 in 500), bleeding (1 in 200), allergic reaction [possibly serious] (1 in 200).    Cardiomyopathy with new acute systolic HF --EF 40-98% --CXR with increased pulmonary edema --BNP 1618  --allergy to lasix with nausea no sulfa allergy  --Resp continue to be elevated   Leukocytosis --WBC 19.1 --> 22.9 --> 12.8 today --N&V on original admit neg CT abd.  --neg influenza A &B  --Resp panel neg --on Azithromycin and Rocephin  Per Dr. Earmon Phoenix note "Discussed his case with Dr Orvan Falconer of ID. We both agree he is at low risk of Covid-19 (no fever, leukopenia, travel, sick contacts, and alternative explanation for symptoms with acute CHF and elevated BNP). However, he has recommended a respiratory and flu panel. Once negative will DC droplet precautions. Would wait on cardiac cath until patient off droplet precautions - test results should  be back tomorrow." --Stopped droplet precautions  Hx of CAD with CABG 2009 (LIMA-LAD, SVG to D1/OM1/OM2, SVG to D2, SVG to PDA) --no cath since CABG  Cath in AM at 0730 Rt and Lt with grafts  IV access- PICC line placed yesterday and CVP was 4  At 0300 today at 12  HTN BP soft 91/69 to 114/95 HR driven, increased HR lower BP  AKI --Cr pk 1.76 now 1.11  --give IV fluids starting at 8 pm   Hyperkalemia resolved now 4.4   DM-2  SSI --glucose was 278 to 332 place on moderate SSI diabetic coordinator consulted and increased freq of SSI and added Lantus now glucose 157 to 258   HLD with LDL 105 on lipitor 20 increase to 80 mg     For questions or updates, please contact [facility] Please consult [email or web address] for contact info under        Signed, Nada Boozer, NP  07/16/2018, 7:57 AM    Patient seen and examined with the above-signed Advanced Practice Provider and/or Housestaff. I personally reviewed laboratory data, imaging studies and relevant notes. I independently examined the patient and formulated the important aspects of the plan. I have edited the note to reflect any of my changes or salient points. I have personally discussed the plan with the patient and/or family.  He is maintaining SR for the most part on IV amio. Still with brief breakthroughs. On heparin with no bleeding.  Has intermittent chest discomfort but not c/w with previous angina. Likely GI.   Co-ox remains low. Volume status ok. Renal function back to baseline.   Will add spiro. Continue digoxin. No b-blocker yet with low output.   Can move to SDU. Cath tomorrow am. Would be careful with volume loading prior to cath. Will change fluids to 3am as he is taking good po.  Arvilla Meres, MD  9:52 AM

## 2018-07-17 ENCOUNTER — Encounter (HOSPITAL_COMMUNITY): Payer: Self-pay | Admitting: Cardiology

## 2018-07-17 ENCOUNTER — Encounter (HOSPITAL_COMMUNITY)
Admission: AD | Disposition: A | Discharge status: Home / Self Care | Payer: Self-pay | Attending: Cardiovascular Disease

## 2018-07-17 ENCOUNTER — Ambulatory Visit (HOSPITAL_COMMUNITY): Admission: RE | Admit: 2018-07-17 | Payer: PRIVATE HEALTH INSURANCE | Source: Home / Self Care | Admitting: Cardiology

## 2018-07-17 DIAGNOSIS — I483 Typical atrial flutter: Secondary | ICD-10-CM

## 2018-07-17 DIAGNOSIS — I509 Heart failure, unspecified: Secondary | ICD-10-CM

## 2018-07-17 DIAGNOSIS — I2510 Atherosclerotic heart disease of native coronary artery without angina pectoris: Secondary | ICD-10-CM

## 2018-07-17 HISTORY — PX: RIGHT/LEFT HEART CATH AND CORONARY/GRAFT ANGIOGRAPHY: CATH118267

## 2018-07-17 HISTORY — PX: A-FLUTTER ABLATION: EP1230

## 2018-07-17 LAB — BASIC METABOLIC PANEL
ANION GAP: 12 (ref 5–15)
BUN: 23 mg/dL (ref 8–23)
CO2: 21 mmol/L — ABNORMAL LOW (ref 22–32)
Calcium: 8 mg/dL — ABNORMAL LOW (ref 8.9–10.3)
Chloride: 100 mmol/L (ref 98–111)
Creatinine, Ser: 1.02 mg/dL (ref 0.61–1.24)
GFR calc Af Amer: >60 mL/min (ref >60)
GFR calc non Af Amer: >60 mL/min (ref >60)
Glucose, Bld: 152 mg/dL — ABNORMAL HIGH (ref 70–99)
POTASSIUM: 3.5 mmol/L (ref 3.5–5.1)
Sodium: 133 mmol/L — ABNORMAL LOW (ref 135–145)

## 2018-07-17 LAB — GLUCOSE, CAPILLARY
Glucose-Capillary: 162 mg/dL — ABNORMAL HIGH (ref 70–99)
Glucose-Capillary: 166 mg/dL — ABNORMAL HIGH (ref 70–99)
Glucose-Capillary: 168 mg/dL — ABNORMAL HIGH (ref 70–99)
Glucose-Capillary: 174 mg/dL — ABNORMAL HIGH (ref 70–99)
Glucose-Capillary: 202 mg/dL — ABNORMAL HIGH (ref 70–99)
Glucose-Capillary: 211 mg/dL — ABNORMAL HIGH (ref 70–99)
Glucose-Capillary: 276 mg/dL — ABNORMAL HIGH (ref 70–99)

## 2018-07-17 LAB — HEPARIN LEVEL (UNFRACTIONATED): Heparin Unfractionated: 0.57 IU/mL (ref 0.30–0.70)

## 2018-07-17 LAB — COOXEMETRY PANEL
Carboxyhemoglobin: 1 % (ref 0.5–1.5)
Methemoglobin: 1.8 % — ABNORMAL HIGH (ref 0.0–1.5)
O2 SAT: 52 %
Total hemoglobin: 13.6 g/dL (ref 12.0–16.0)

## 2018-07-17 LAB — APTT: aPTT: 99 seconds — ABNORMAL HIGH (ref 24–36)

## 2018-07-17 SURGERY — A-FLUTTER ABLATION

## 2018-07-17 SURGERY — RIGHT/LEFT HEART CATH AND CORONARY/GRAFT ANGIOGRAPHY
Anesthesia: LOCAL

## 2018-07-17 MED ORDER — INSULIN ASPART 100 UNIT/ML ~~LOC~~ SOLN
3.0000 [IU] | Freq: Three times a day (TID) | SUBCUTANEOUS | Status: DC
  Administered 2018-07-17 – 2018-07-18 (×3): 3 [IU] via SUBCUTANEOUS

## 2018-07-17 MED ORDER — HEPARIN (PORCINE) IN NACL 1000-0.9 UT/500ML-% IV SOLN
INTRAVENOUS | Status: DC | PRN
  Administered 2018-07-17: 500 mL

## 2018-07-17 MED ORDER — HEPARIN (PORCINE) IN NACL 1000-0.9 UT/500ML-% IV SOLN
INTRAVENOUS | Status: AC
  Filled 2018-07-17: qty 500

## 2018-07-17 MED ORDER — AMIODARONE LOAD VIA INFUSION
150.0000 mg | Freq: Once | INTRAVENOUS | Status: AC
  Administered 2018-07-17: 150 mg via INTRAVENOUS
  Filled 2018-07-17: qty 83.34

## 2018-07-17 MED ORDER — SODIUM CHLORIDE 0.9% FLUSH: 3.0000 mL | INTRAVENOUS | Status: DC | PRN

## 2018-07-17 MED ORDER — INSULIN ASPART 100 UNIT/ML ~~LOC~~ SOLN
0.0000 [IU] | Freq: Three times a day (TID) | SUBCUTANEOUS | Status: DC
  Administered 2018-07-17: 3 [IU] via SUBCUTANEOUS
  Administered 2018-07-17: 8 [IU] via SUBCUTANEOUS
  Administered 2018-07-17: 5 [IU] via SUBCUTANEOUS
  Administered 2018-07-18: 3 [IU] via SUBCUTANEOUS

## 2018-07-17 MED ORDER — POTASSIUM CHLORIDE CRYS ER 20 MEQ PO TBCR
20.0000 meq | EXTENDED_RELEASE_TABLET | Freq: Once | ORAL | Status: AC
  Administered 2018-07-18: 20 meq via ORAL
  Filled 2018-07-17: qty 1

## 2018-07-17 MED ORDER — ACETAMINOPHEN 325 MG PO TABS: 650.0000 mg | ORAL_TABLET | ORAL | Status: DC | PRN

## 2018-07-17 MED ORDER — IOHEXOL 350 MG/ML SOLN
INTRAVENOUS | Status: DC | PRN
  Administered 2018-07-17: 75 mL via INTRA_ARTERIAL

## 2018-07-17 MED ORDER — BUPIVACAINE HCL (PF) 0.25 % IJ SOLN
INTRAMUSCULAR | Status: AC
  Filled 2018-07-17: qty 60

## 2018-07-17 MED ORDER — HEPARIN (PORCINE) 25000 UT/250ML-% IV SOLN: 1700.0000 [IU]/h | INTRAVENOUS | Status: DC

## 2018-07-17 MED ORDER — SODIUM CHLORIDE 0.9 % IV SOLN: 250.0000 mL | INTRAVENOUS | Status: DC | PRN

## 2018-07-17 MED ORDER — MIDAZOLAM HCL 2 MG/2ML IJ SOLN
INTRAMUSCULAR | Status: DC | PRN
  Administered 2018-07-17: 1 mg via INTRAVENOUS

## 2018-07-17 MED ORDER — SODIUM CHLORIDE 0.9% FLUSH
3.0000 mL | Freq: Two times a day (BID) | INTRAVENOUS | Status: DC
  Administered 2018-07-18: 3 mL via INTRAVENOUS

## 2018-07-17 MED ORDER — ALPRAZOLAM 0.25 MG PO TABS
0.2500 mg | ORAL_TABLET | Freq: Three times a day (TID) | ORAL | Status: DC | PRN
  Administered 2018-07-17 – 2018-07-18 (×2): 0.25 mg via ORAL
  Filled 2018-07-17 (×2): qty 1

## 2018-07-17 MED ORDER — BUPIVACAINE HCL (PF) 0.25 % IJ SOLN
INTRAMUSCULAR | Status: DC | PRN
  Administered 2018-07-17: 40 mL

## 2018-07-17 MED ORDER — LIDOCAINE HCL (PF) 1 % IJ SOLN
INTRAMUSCULAR | Status: DC | PRN
  Administered 2018-07-17: 10 mL

## 2018-07-17 MED ORDER — SODIUM CHLORIDE 0.9% FLUSH
3.0000 mL | Freq: Two times a day (BID) | INTRAVENOUS | Status: DC
  Administered 2018-07-17 – 2018-07-18 (×2): 3 mL via INTRAVENOUS

## 2018-07-17 MED ORDER — FENTANYL CITRATE (PF) 100 MCG/2ML IJ SOLN
INTRAMUSCULAR | Status: AC
  Filled 2018-07-17: qty 2

## 2018-07-17 MED ORDER — MIDAZOLAM HCL 5 MG/5ML IJ SOLN
INTRAMUSCULAR | Status: DC | PRN
  Administered 2018-07-17 (×3): 1 mg via INTRAVENOUS

## 2018-07-17 MED ORDER — MIDAZOLAM HCL 5 MG/5ML IJ SOLN
INTRAMUSCULAR | Status: AC
  Filled 2018-07-17: qty 5

## 2018-07-17 MED ORDER — FENTANYL CITRATE (PF) 100 MCG/2ML IJ SOLN
INTRAMUSCULAR | Status: DC | PRN
  Administered 2018-07-17: 25 ug via INTRAVENOUS

## 2018-07-17 MED ORDER — FENTANYL CITRATE (PF) 100 MCG/2ML IJ SOLN
INTRAMUSCULAR | Status: DC | PRN
  Administered 2018-07-17 (×3): 12.5 ug via INTRAVENOUS

## 2018-07-17 MED ORDER — ONDANSETRON HCL 4 MG/2ML IJ SOLN: 4.0000 mg | Freq: Four times a day (QID) | INTRAMUSCULAR | Status: DC | PRN

## 2018-07-17 MED ORDER — AMIODARONE HCL 200 MG PO TABS
200.0000 mg | ORAL_TABLET | Freq: Two times a day (BID) | ORAL | Status: DC
  Administered 2018-07-17 – 2018-07-18 (×2): 200 mg via ORAL
  Filled 2018-07-17 (×2): qty 1

## 2018-07-17 MED ORDER — APIXABAN 5 MG PO TABS
5.0000 mg | ORAL_TABLET | Freq: Two times a day (BID) | ORAL | Status: DC
  Administered 2018-07-17 – 2018-07-18 (×2): 5 mg via ORAL
  Filled 2018-07-17 (×2): qty 1

## 2018-07-17 SURGICAL SUPPLY — 8 items
CATH BLAZERPRIME XP (ABLATOR) ×2 IMPLANT
CATH JOSEPH QUAD ALLRED 6F REP (CATHETERS) ×2 IMPLANT
CATH POLARIS X 2.5/5/2.5 DECAP (CATHETERS) ×2 IMPLANT
PACK EP LATEX FREE (CUSTOM PROCEDURE TRAY) ×1
PACK EP LF (CUSTOM PROCEDURE TRAY) ×1 IMPLANT
PAD PRO RADIOLUCENT 2001M-C (PAD) ×4 IMPLANT
SHEATH PINNACLE 6F 10CM (SHEATH) ×4 IMPLANT
SHEATH PINNACLE 8F 10CM (SHEATH) ×2 IMPLANT

## 2018-07-17 SURGICAL SUPPLY — 11 items
CATH INFINITI 5 FR LCB (CATHETERS) ×2 IMPLANT
CATH INFINITI 5FR MULTPACK ANG (CATHETERS) ×2 IMPLANT
CATH SWAN GANZ 7F STRAIGHT (CATHETERS) ×2 IMPLANT
KIT HEART LEFT (KITS) ×2 IMPLANT
PACK CARDIAC CATHETERIZATION (CUSTOM PROCEDURE TRAY) ×2 IMPLANT
SHEATH PINNACLE 5F 10CM (SHEATH) ×2 IMPLANT
SHEATH PINNACLE 7F 10CM (SHEATH) ×2 IMPLANT
SHEATH PROBE COVER 6X72 (BAG) ×2 IMPLANT
TRANSDUCER W/STOPCOCK (MISCELLANEOUS) ×2 IMPLANT
TUBING CIL FLEX 10 FLL-RA (TUBING) ×2 IMPLANT
WIRE EMERALD 3MM-J .035X150CM (WIRE) ×2 IMPLANT

## 2018-07-17 NOTE — Progress Notes (Addendum)
In from the cath lab awake and alert. Bedrest emphasized for 4 hours. Instructed  To call for any signs of bleeding and to avoid bending right groin.

## 2018-07-17 NOTE — Plan of Care (Signed)
  Problem: Education: Goal: Knowledge of General Education information will improve Description Including pain rating scale, medication(s)/side effects and non-pharmacologic comfort measures Outcome: Progressing   Problem: Health Behavior/Discharge Planning: Goal: Ability to manage health-related needs will improve Outcome: Progressing   Problem: Clinical Measurements: Goal: Ability to maintain clinical measurements within normal limits will improve Outcome: Progressing Goal: Will remain free from infection Outcome: Progressing Goal: Diagnostic test results will improve Outcome: Progressing Goal: Respiratory complications will improve Outcome: Progressing Goal: Cardiovascular complication will be avoided Outcome: Progressing   Problem: Activity: Goal: Risk for activity intolerance will decrease Outcome: Progressing   Problem: Nutrition: Goal: Adequate nutrition will be maintained Outcome: Progressing   Problem: Coping: Goal: Level of anxiety will decrease Outcome: Progressing   Problem: Elimination: Goal: Will not experience complications related to bowel motility Outcome: Progressing Goal: Will not experience complications related to urinary retention Outcome: Progressing   Problem: Pain Managment: Goal: General experience of comfort will improve Outcome: Progressing   Problem: Safety: Goal: Ability to remain free from injury will improve Outcome: Progressing   Problem: Skin Integrity: Goal: Risk for impaired skin integrity will decrease Outcome: Progressing   Problem: Education: Goal: Understanding of CV disease, CV risk reduction, and recovery process will improve Outcome: Progressing Goal: Individualized Educational Video(s) Outcome: Progressing   Problem: Activity: Goal: Ability to return to baseline activity level will improve Outcome: Progressing   Problem: Cardiovascular: Goal: Ability to achieve and maintain adequate cardiovascular perfusion  will improve Outcome: Progressing Goal: Vascular access site(s) Level 0-1 will be maintained Outcome: Progressing   Problem: Health Behavior/Discharge Planning: Goal: Ability to safely manage health-related needs after discharge will improve Outcome: Progressing   Problem: Education: Goal: Understanding of cardiac disease, CV risk reduction, and recovery process will improve Outcome: Progressing Goal: Understanding of medication regimen will improve Outcome: Progressing Goal: Individualized Educational Video(s) Outcome: Progressing   Problem: Activity: Goal: Ability to tolerate increased activity will improve Outcome: Progressing   Problem: Cardiac: Goal: Ability to achieve and maintain adequate cardiopulmonary perfusion will improve Outcome: Progressing Goal: Vascular access site(s) Level 0-1 will be maintained Outcome: Progressing   Problem: Health Behavior/Discharge Planning: Goal: Ability to safely manage health-related needs after discharge will improve Outcome: Progressing   Problem: Education: Goal: Understanding of disease, treatment, and recovery process will improve Outcome: Progressing   Problem: Activity: Goal: Ability to return to baseline activity level will improve Outcome: Progressing   Problem: Cardiac: Goal: Ability to maintain adequate cardiovascular perfusion will improve Outcome: Progressing Goal: Vascular access site(s) Level 0-1 will be maintained Outcome: Progressing   Problem: Health Behavior/ Discharge Planning: Goal: Ability to safely manage health related needs after discharge Outcome: Progressing   Problem: Education: Goal: Understanding of CV disease, CV risk reduction, and recovery process will improve Outcome: Progressing Goal: Individualized Educational Video(s) Outcome: Progressing

## 2018-07-17 NOTE — Progress Notes (Signed)
Inpatient Diabetes Program Recommendations  AACE/ADA: New Consensus Statement on Inpatient Glycemic Control (2015)  Target Ranges:  Prepandial:   less than 140 mg/dL      Peak postprandial:   less than 180 mg/dL (1-2 hours)      Critically ill patients:  140 - 180 mg/dL   Lab Results  Component Value Date   GLUCAP 211 (H) 07/17/2018   HGBA1C 7.5 (H) 07/14/2018    Review of Glycemic Control Results for [name], [name] (MRN [id number]) as of 07/17/2018 11:21  Ref. Range 07/16/2018 08:00 07/16/2018 11:08 07/16/2018 16:05 07/16/2018 20:15 07/17/2018 00:02 07/17/2018 03:28 07/17/2018 06:51  Glucose-Capillary Latest Ref Range: 70 - 99 mg/dL 361 (H) 224 (H) 497 (H) 317 (H) 174 (H) 166 (H) 211 (H)   Diabetes history: DM 2 Outpatient Diabetes medications:  Glucovance 5-500 mg bid, Invokana 100 mg daily  Current orders for Inpatient glycemic control:  Novolog moderate tid and hs, Lantus 20 units daily   Inpatient Diabetes Program Recommendations:     Fasting glucose 211 after being NPO for cath. Please consider increasing Lantus to 25 units daily.    Patient to start Novolog meal coverage 3 units tid with meals.    **A1C indicates fair control of diabetes prior to admit 7.5%.  May need adjustment of home oral medications for DM at d/c.  He should not need insulin at d/c based on A1C.   Thanks,  Christena Deem RN, MSN, BC-ADM Inpatient Diabetes Coordinator Team Pager [phone] (8a-5p)

## 2018-07-17 NOTE — Consult Note (Addendum)
Cardiology Consultation:   Patient ID: [name] MRN: [id number]; DOB: 1955-05-23  Admit date: 07/14/2018 Date of Consult: 07/17/2018  Primary Care Provider: [name], MD Primary Cardiologist: [name], MD  Primary Electrophysiologist:  None    Patient Profile:   [name] is a 62 y.o. male with a hx of CAD (CABG 2009), HTN, HLD, DM who is being seen today for the evaluation of AFlutter at the request of Dr. Gala Romney.  History of Present Illness:   [name] in review of his record, presented to [facility] ER 07/13/18 with nausea and vomiting for the past few weeks. He was found to be in Afib RVR in the 150-160s. He was started on a cardizem drip and admitted. He underwent a TEE/DCCV with successful cardioversion to NSR. His troponins were elevated: 0.97 --> 1.32 --> 2.27 --> 2.44. EKG prior to cardioversion with Afib RVR with ST depression in precordial leads. EKG following cardioversion reviewed and showed sinus rhythm with a TWI in V2, resolved on subsequent tracings. TTE was performed and showed a reduced EF to 20-25%. CXR with left pleural effusion and consolidation suggesting PNA. IV rocephin was given x 1 dose. CT abdomen pelvis for nausea and vomiting largely negative for acute findings. Leukocytosis with WBC 22 and elevated liver enzymes. Lipase and amylase were WNL. ProBNP elevated to 6612 from 1805 the day before. Renal function impaired with sCr 1.51. He is anticoagulated with xarelto following cardioversion. Cardiology was unavailable today at OSH and requested transfer to [facility] 07/14/2018, admitted.  The patient had been feeling poorly for a few weeks, reported shortness of breath, weakness, and exertional chest pain over the past month, dr. Excell Seltzer d/w Dr Orvan Falconer of ID. Felt patient was low risk of Covid-19 (no fever, leukopenia, travel, sick contacts, and alternative explanation for symptoms with acute CHF and elevated BNP), a respiratory panel was done and  negative.  Dr. Earmon Phoenix note also reports his review of EKG noted AFlutter.  Also noted : the patient's father had been ill for over 1 week. Police were dispatched to the father's residence for a welfare check and found him deceased. The sheriff's office called the patient and informed him while in transit to the hospital with [facility]  She has been treated with diuresis, antibiotics, amio gtt. EP is asked to see for AFlutter, possible ablation  LABS: Peak Trop I:  22.10  Trending down, last 16.39 Peak WBC 22.9 > trending down, 12.8 (afebrile last 24hrs, 99.1 T max) Coox 52.0 K+ 3.5 BUN/Creat 23/1.02 H/H 12/39 Plts 238  Rate/rhythm meds: Dig 0.125mg  daily (first dose) amio gtt (since 07/14/2018) ranexa 500mg  BID (started yesterday)   Past Medical History:  Diagnosis Date  . Coronary atherosclerosis of native coronary artery    Multivessel, LVEF 60%  . Essential hypertension, benign   . Hyperlipidemia   . Type 2 diabetes mellitus (HCC)     Past Surgical History:  Procedure Laterality Date  . CORONARY ARTERY BYPASS GRAFT  09/09   LIMA to LAD, SVG to D1/OM1/OM2, SVG to D2, SVG to PDA  . RIGHT/LEFT HEART CATH AND CORONARY/GRAFT ANGIOGRAPHY N/A 07/17/2018   Procedure: RIGHT/LEFT HEART CATH AND CORONARY/GRAFT ANGIOGRAPHY;  Surgeon: [place], Peter M, MD;  Location: [facility] INVASIVE CV LAB;  Service: Cardiovascular;  Laterality: N/A;     Home Medications:  Prior to Admission medications   Medication Sig Start Date End Date Taking? Authorizing Provider  amoxicillin (AMOXIL) 500 MG capsule Take 500 mg by mouth 3 (  three) times daily. For 7 days 07/06/18  Yes [provider]  aspirin 81 MG tablet Take 81 mg by mouth daily.     Yes [provider]  atorvastatin (LIPITOR) 20 MG tablet Take 20 mg by mouth every evening.  02/07/12  Yes Jonelle Sidle, MD  fexofenadine (ALLEGRA) 180 MG tablet Take 180 mg by mouth as needed for allergies or rhinitis.   Yes [provider]  glyBURIDE-metformin (GLUCOVANCE) 5-500 MG per tablet Take 1 tablet by mouth 2 (two) times daily.    Yes [provider]  INVOKANA 100 MG TABS tablet Take 100 mg by mouth daily before breakfast.  10/04/16  Yes [provider]  lisinopril (PRINIVIL,ZESTRIL) 5 MG tablet Take 2.5 mg by mouth daily.    Yes [provider]  metoprolol tartrate (LOPRESSOR) 25 MG tablet TAKE ONE TABLET BY MOUTH TWICE DAILY Patient taking differently: Take 25 mg by mouth 2 (two) times daily.  04/02/18  Yes Jonelle Sidle, MD  Omega-3 Fatty Acids (FISH OIL) 1000 MG CAPS Take 1,000 mg by mouth 2 (two) times daily.    Yes [provider]  pantoprazole (PROTONIX) 40 MG tablet Take 40 mg by mouth daily.     Yes [provider]    Inpatient Medications: Scheduled Meds: . aspirin EC  81 mg Oral Daily  . atorvastatin  80 mg Oral QPM  . azithromycin  500 mg Oral q1800  . Chlorhexidine Gluconate Cloth  6 each Topical Daily  . digoxin  0.125 mg Oral Daily  . insulin aspart  0-15 Units Subcutaneous TID AC & HS  . insulin aspart  3 Units Subcutaneous TID WC  . insulin glargine  20 Units Subcutaneous Daily  . loratadine  10 mg Oral Daily  . pantoprazole  40 mg Oral Daily  . potassium chloride  20 mEq Oral Once  . ranolazine  500 mg Oral BID  . sodium chloride flush  10-40 mL Intracatheter Q12H  . sodium chloride flush  3 mL Intravenous Q12H  . spironolactone  12.5 mg Oral Daily   Continuous Infusions: . sodium chloride    . amiodarone 30 mg/hr (07/17/18 1035)  . cefTRIAXone (ROCEPHIN)  IV Stopped (07/16/18 1712)  . heparin     PRN Meds: sodium chloride, acetaminophen, ALPRAZolam, guaiFENesin-dextromethorphan, nitroGLYCERIN, ondansetron (ZOFRAN) IV, sodium chloride flush, sodium chloride flush  Allergies:    Allergies  Allergen Reactions  . Furosemide Nausea Only    Social History:   Social History   Socioeconomic History  . Marital status:  Married    Spouse name: [name]  . Number of children: Not on file  . Years of education: Not on file  . Highest education level: Not on file  Occupational History    Employer: UNEMPLOYED  Social Needs  . Financial resource strain: Not on file  . Food insecurity:    Worry: Not on file    Inability: Not on file  . Transportation needs:    Medical: Not on file    Non-medical: Not on file  Tobacco Use  . Smoking status: Former Smoker    Types: Cigarettes    Last attempt to quit: 04/23/1983    Years since quitting: 35.2  . Smokeless tobacco: Never Used  . Tobacco comment: Year Quit: 1985  Substance and Sexual Activity  . Alcohol use: No    Alcohol/week: 0.0 standard drinks  . Drug use: No  . Sexual activity: Not on file  Lifestyle  .  Physical activity:    Days per week: Not on file    Minutes per session: Not on file  . Stress: Not on file  Relationships  . Social connections:    Talks on phone: Not on file    Gets together: Not on file    Attends religious service: Not on file    Active member of club or organization: Not on file    Attends meetings of clubs or organizations: Not on file    Relationship status: Not on file  . Intimate partner violence:    Fear of current or ex partner: Not on file    Emotionally abused: Not on file    Physically abused: Not on file    Forced sexual activity: Not on file  Other Topics Concern  . Not on file  Social History Narrative  . Not on file    Family History:   Family History  Problem Relation Age of Onset  . Coronary artery disease Other      ROS:  Please see the history of present illness.  All other ROS reviewed and negative.     Physical Exam/Data:   Vitals:   07/17/18 0810 07/17/18 0815 07/17/18 0820 07/17/18 0910  BP: 103/74 100/70 111/76   Pulse: (!) 124 (!) 123 (!) 126 (!) 126  Resp: Temp:      TempSrc:      SpO2: 94% 94% 94%   Weight:      Height:        Intake/Output Summary (Last 24 hours)  at 07/17/2018 1109 Last data filed at 07/17/2018 0600 Gross per 24 hour  Intake 1354.56 ml  Output 800 ml  Net 554.56 ml   Last 3 Weights 07/17/2018 07/16/2018 07/15/2018  Weight (lbs) 162 lb 4.1 oz 157 lb 13.6 oz 155 lb 10.3 oz  Weight (kg) 73.6 kg 71.6 kg 70.6 kg     Body mass index is 27 kg/m.  General:  Well nourished, well developed, in no acute distress HEENT: normal Lymph: no adenopathy Neck: no JVD Endocrine:  No thryomegaly Vascular: No carotid bruits  Cardiac:  Regular, tachycardic; no murmurs, gallops or rubs Lungs:  Diminished at the bases, no wheezing, rhonchi or rales  Abd: soft, nontender  Ext: no edema, R groin is soft, non-tender, no bleeding, no hematoma Musculoskeletal:  No deformities Skin: warm and dry  Neuro:  no focal abnormalities noted Psych:  Normal affect   EKG:  The EKG was personally reviewed and demonstrates:   EKGs are reviewed,  SR and AFlutter w/RVR Telemetry:  Telemetry was personally reviewed and demonstrates:   SR rates 80's, he has been in/out of AFlutter as well as AFib. Rates poorly controlled when out of rhythm.   Relevant CV Studies:  07/17/2018; LHC  Ost LAD to Prox LAD lesion is 100% stenosed.  Ost 1st Mrg to 1st Mrg lesion is 100% stenosed.  Ost 2nd Mrg to 2nd Mrg lesion is 100% stenosed.  Ost 3rd Mrg to 3rd Mrg lesion is 80% stenosed.  Prox Cx to Mid Cx lesion is 50% stenosed.  Prox RCA lesion is 70% stenosed.  Mid RCA lesion is 80% stenosed.  Dist RCA lesion is 60% stenosed.  Ost RPDA to RPDA lesion is 100% stenosed.  LIMA graft was visualized by angiography and is large.  The graft exhibits no disease.  Seq SVG- first diagonal, OM1, OM2 graft was visualized by angiography.  Origin to Prox Graft lesion before  1st Diag is 100% stenosed.  SVG graft was visualized by angiography.  Origin lesion is 100% stenosed.  SVG graft was visualized by angiography.  Origin lesion is 100% stenosed.  LV end diastolic  pressure is mildly elevated.  Hemodynamic findings consistent with moderate pulmonary hypertension.  1. Severe 3 vessel occlusive CAD    - 100% proximal LAD    - 100% ostial OM 1 and OM 2    - 80% OM 3. This is a fairly small branch    - Diffuse RCA disease with long segmental 70% proximal and 80% mid vessel disease. 100% PDA that fills by left to right collaterals. 2. Patent LIMA to the LAD 3. Occluded SVG sequential to the first diagonal, OM1, and OM2. This appears to be recent 4. Occluded SVG to the second diagonal. The first and second diagonal fill from the LAD via the LIMA 5. Occluded SVG to PDA. This fills by left to right collaterals. 6. Mildly elevated LV filling pressures 7. Moderate pulmonary HTN 8. Markedly reduced cardiac output. Index 1.58. 9. Patient was in atrial flutter with HR 130 bpm during this procedure.    07/14/2018: TTE IMPRESSIONS  1. The left ventricle has severely reduced systolic function, with an ejection fraction of 25-30%. The cavity size was mildly dilated. There is mildly increased left ventricular wall thickness. Left ventricular diastolic Doppler parameters are  consistent with pseudonormalization. Elevated left ventricular end-diastolic pressure.  2. Severe akinesis of the left ventricular, basal-mid inferior wall and lateral wall.  3. Moderate pleural effusion in the left lateral region.  4. The pericardial effusion is posterior to the left ventricle.  5. Trivial pericardial effusion is present.  6. The mitral valve is degenerative. Mild thickening of the mitral valve leaflet. Mitral valve regurgitation is moderate by color flow Doppler.  7. The aortic valve is tricuspid Mild sclerosis of the aortic valve.  8. The ascending aorta and aortic root are normal in size and structure.  9. The inferior vena cava was normal in size with <50% respiratory variability. 10. The right ventricle has moderately reduced systolic function. The cavity was normal. There  is no increase in right ventricular wall thickness. 11. The interatrial septum was not well visualized. 12. The tricuspid valve is grossly normal.  SUMMARY LVEF 25-30%, mildly dilated LV, mild LVH, inferior and lateral severe hypokinesis to akinesis, grade 2 DD, moderate MR, normal biatrial size, moderate RV systolic dysfunction, RV pacer or AICD noted, IVC suggests elevated RA pressure of 8 mmHg  FINDINGS  Left Ventricle: The left ventricle has severely reduced systolic function, with an ejection fraction of 25-30%. The cavity size was mildly dilated. There is mildly increased left ventricular wall thickness. Left ventricular diastolic Doppler parameters  are consistent with pseudonormalization. Elevated left ventricular end-diastolic pressure Severe akinesis of the left ventricular, basal-mid inferior wall and lateral wall. Right Ventricle: The right ventricle has moderately reduced systolic function. The cavity was normal. There is no increase in right ventricular wall thickness. Pacing wire/catheter visualized in the right ventricle. Left Atrium: left atrial size was normal in size Right Atrium: right atrial size was normal in size. Right atrial pressure is estimated at 8 mmHg. Interatrial Septum: The interatrial septum was not well visualized. Pericardium: Trivial pericardial effusion is present. The pericardial effusion is posterior to the left ventricle. There is a moderate pleural effusion in the left lateral region. Mitral Valve: The mitral valve is degenerative in appearance. Mild thickening of the mitral valve leaflet. Mitral valve  regurgitation is moderate by color flow Doppler. Tricuspid Valve: The tricuspid valve is grossly normal. Tricuspid valve regurgitation is trivial by color flow Doppler. Aortic Valve: The aortic valve is tricuspid Mild sclerosis of the aortic valve. Aortic valve regurgitation was not visualized by color flow Doppler. There is no evidence of aortic valve  stenosis. Pulmonic Valve: The pulmonic valve was grossly normal. Pulmonic valve regurgitation is not visualized by color flow Doppler. Aorta: The ascending aorta and aortic root are normal in size and structure. Venous: The inferior vena cava measures 1.84 cm, is normal in size with less than 50% respiratory variability.     Laboratory Data:  Chemistry Recent Labs  Lab 07/15/18 0355 07/16/18 0430 07/17/18 0152  NA 135 132* 133*  K 5.2* 4.4 3.5  CL 99 97* 100  CO2 13* 21* 21*  GLUCOSE 347* 280* 152*  BUN 41* 31* 23  CREATININE 1.76* 1.11 1.02  CALCIUM 8.4* 7.6* 8.0*  GFRNONAA 41* >60 >60  GFRAA 47* >60 >60  ANIONGAP 23* 14 12    Recent Labs  Lab 07/14/18 1606 07/15/18 1057  PROT 6.8 6.1*  ALBUMIN 3.3* 2.9*  AST 125* 86*  ALT 55* 46*  ALKPHOS 63 59  BILITOT 1.1 2.4*   Hematology Recent Labs  Lab 07/14/18 1606 07/15/18 0355 07/16/18 0430  WBC 19.1* 22.9* 12.8*  RBC 5.25 5.23 4.43  HGB 15.2 15.0 12.9*  HCT 47.3 47.8 39.6  MCV 90.1 91.4 89.4  MCH 29.0 28.7 29.1  MCHC 32.1 31.4 32.6  RDW 13.0 13.0 13.2  PLT 323 331 238   Cardiac Enzymes Recent Labs  Lab 07/14/18 1606 07/14/18 2127 07/15/18 0355  TROPONINI 22.10* 18.09* 16.39*   No results for input(s): TROPIPOC in the last 168 hours.  BNP Recent Labs  Lab 07/14/18 1606  BNP 1,618.1*    DDimer No results for input(s): DDIMER in the last 168 hours.  Radiology/Studies:   Dg Chest Port 1 View Result Date: 07/15/2018 CLINICAL DATA:  Follow-up PICC line EXAM: PORTABLE CHEST 1 VIEW COMPARISON:  07/15/2018 FINDINGS: New right-sided PICC line is noted with the catheter tip just below the cavoatrial junction. Cardiac shadow is stable. Postoperative changes are seen. Improved aeration is noted with slight decrease in the degree of vascular congestion. Left retrocardiac atelectasis is again seen. IMPRESSION: New right-sided PICC line just below the cavoatrial junction. Persistent left basilar atelectasis.  Improvement in the degree of vascular congestion and edema. Electronically Signed   By: Alcide Clever M.D.   On: 07/15/2018 09:12      Assessment and Plan:   1.  AFlutter/AFib, new onset      Rates uncontrolled  In review of the H&P  He arrived to [facility] in rapid AFib, seems started on xarelto and underwent TEE/DCCV Transitioned to heparin gtt here though held this AM for his cath  He has been back in a rapid flutter since yesterday afternoon In/out of SR prior to that He has been on amiodarone gtt since 3/24  His predominant rhythm appears to be AFlutter but has had AFib as well. LA dilated at 47mm CHF, (?pneumonia), emotional upset both contribute to difficulty with rate/rhythm I am sure  Looks like he went into flutter persistently about 1100 yesterday, was in mostly SR the days prior with only short periods of AFlutter He has been a/c with heparin until this AM for his cath  Dr. [name] has seen and examined the patient.  Flutter looks typical, and will plan to  proceed with EPS/ablation today He discussed with the patient and his wife at bedside, the procedure, potential risks/benefits.  Also discussed that we have as well seen some AFI=ib and would need to be continued on anticoagulation (indefinately, and in the next 3 weeks uninterrupted), and likely PO amiodarone as well.  2. NSTEMI     S/p cath this AM, no intervention     Not felt to have PCI options per Dr. Prescott Gum note today      3. CHF     I/O appear euvolemic     RHC with normal filling pressures but markedly depressed CO. EF ~25%  Dr. Gala Romney notes: BP too soft for ARB. No b-blocker with shock. Will defer inotropes for now but may need. Hopefully outputs will improve with restoration of NSR but may need VAD consideration in future   For questions or updates, please contact [facility] Please consult [email or web address] for contact info under     Signed, Sheilah Pigeon, PA-C  07/17/2018 11:09 AM   EP Attending  Patient seen and examined. Agree with the findings as noted above. The patient has developed atrial flutter with a RVR, and has undergone DCCV with early return of atrial flutter. He has some tele which suggests he may have some atrial fib but minmally so. His flutter has not responded to IV amiodarone. His EF is down and he has severe 3 vessel CAD with patent grafts. I have discussed the indications/risiks/benefits/goals/expectations of EP study and catheter ablation and he wishes to proceed.  Leonia Reeves.D.

## 2018-07-17 NOTE — Progress Notes (Signed)
Back from the EP lab by bed awake and alert and awake. Bedrest emphasized for 6 hours and no bending of the left groin.

## 2018-07-17 NOTE — Progress Notes (Signed)
Kept NPO for ablation, consent signed. Right IJ and right groin prep. done.  Voided.  To EP lab by bed awake and alert.

## 2018-07-17 NOTE — Progress Notes (Addendum)
ANTICOAGULATION CONSULT NOTE - Follow-Up Consult  Pharmacy Consult for heparin Indication: chest pain/ACS and atrial fibrillation  Allergies  Allergen Reactions  . Furosemide Nausea Only    Patient Measurements: Height: 5\' 5"  (165.1 cm) Weight: 162 lb 4.1 oz (73.6 kg) IBW/kg (Calculated) : 61.5 Heparin Dosing Weight: 70kg  Vital Signs: Temp: 98.7 F (37.1 C) (03/27 0331) Temp Source: Oral (03/27 0331) BP: 94/69 (03/27 0400) Pulse Rate: 125 (03/27 0400)  Labs: Recent Labs    07/14/18 1606  07/14/18 2127  07/15/18 0355  07/15/18 2140 07/16/18 0430 07/16/18 0924 07/16/18 1548 07/17/18 0152  HGB 15.2  --   --   --  15.0  --   --  12.9*  --   --   --   HCT 47.3  --   --   --  47.8  --   --  39.6  --   --   --   PLT 323  --   --   --  331  --   --  238  --   --   --   APTT  --    < >  --    < >  --    < > 56*  --  73*  --  99*  HEPARINUNFRC  --    < >  --    < >  --    < >  --   --  0.52 0.46 0.57  CREATININE 1.53*  --   --   --  1.76*  --   --  1.11  --   --  1.02  TROPONINI 22.10*  --  18.09*  --  16.39*  --   --   --   --   --   --    < > = values in this interval not displayed.    Estimated Creatinine Clearance: 66.2 mL/min (by C-G formula based on SCr of 1.02 mg/dL).   Medical History: Past Medical History:  Diagnosis Date  . Coronary atherosclerosis of native coronary artery    Multivessel, LVEF 60%  . Essential hypertension, benign   . Hyperlipidemia   . Type 2 diabetes mellitus (HCC)    Assessment: 59 yoM transferred from OSH after presenting with AFib RVR s/p DCCV. Pt started on Xarelto and now switched to IV heparin with plans for cath to rule out ischemia.  Heparin level remains therapeutic at 0.57, on 1700 units/hr. No s/sx of bleeding. No infusion issues.     Goal of Therapy:  Heparin level 0.3-0.7 units/ml aPTT 66-102 seconds Monitor platelets by anticoagulation protocol: Yes   Plan:  -Will continue heparin at 1700 units/hr until time of  cardiac cath  -Will monitor daily heparin level and CBC and for s/sx of bleeding  Sherron Monday, PharmD, BCCCP Clinical Pharmacist  Pager: [phone] Phone: [phone] Please refer to AMION for [facility] numbers 07/17/2018   7:58 AM    ADDENDUM Underwent cardiac cath today - plan to resume heparin 8 hours after sheath removal (documented at 0818 on 3/27). Will resume at 1700 units/hr today at 1630 and monitor heparin level in morning with AM labs. Will f/u with plans to transition back to Xarelto.  Sherron Monday, PharmD, BCCCP Clinical Pharmacist  Pager: [phone] Phone: [phone]

## 2018-07-17 NOTE — Interval H&P Note (Signed)
History and Physical Interval Note:  07/17/2018 7:25 AM  [name] JAELON NAIDOO  has presented today for surgery, with the diagnosis of Non stemi.  The various methods of treatment have been discussed with the patient and family. After consideration of risks, benefits and other options for treatment, the patient has consented to  Procedure(s): RIGHT/LEFT HEART CATH AND CORONARY/GRAFT ANGIOGRAPHY (N/A) as a surgical intervention.  The patient's history has been reviewed, patient examined, no change in status, stable for surgery.  I have reviewed the patient's chart and labs.  Questions were answered to the patient's satisfaction.   Cath Lab Visit (complete for each Cath Lab visit)  Clinical Evaluation Leading to the Procedure:   ACS: Yes.    Non-ACS:    Anginal Classification: CCS IV  Anti-ischemic medical therapy: Maximal Therapy (2 or more classes of medications)  Non-Invasive Test Results: No non-invasive testing performed  Prior CABG: Previous CABG        Theron Arista [facility] 07/17/2018 7:25 AM

## 2018-07-17 NOTE — Progress Notes (Addendum)
Progress Note  Patient Name: [name] Date of Encounter: 07/17/2018  Primary Cardiologist: Nona DellSamuel McDowell, MD   Subjective   No chest pain Pt is upset that no stents placed, was hoping to be able to go home.  He is currently angry because his wife can no longer stay with him.  He was just notified of this and his father is to be buried in an hour or so.  It is difficult time for him and he would like his wife to stay.   He also requests if [facility] [place] now the cath is over.   He is agreeable to EP consult.   Inpatient Medications    Scheduled Meds: . aspirin EC  81 mg Oral Daily  . atorvastatin  80 mg Oral QPM  . azithromycin  500 mg Oral q1800  . Chlorhexidine Gluconate Cloth  6 each Topical Daily  . digoxin  0.125 mg Oral Daily  . insulin aspart  0-15 Units Subcutaneous Q4H  . insulin glargine  20 Units Subcutaneous Daily  . loratadine  10 mg Oral Daily  . pantoprazole  40 mg Oral Daily  . potassium chloride  20 mEq Oral Once  . ranolazine  500 mg Oral BID  . sodium chloride flush  10-40 mL Intracatheter Q12H  . sodium chloride flush  3 mL Intravenous Q12H  . sodium chloride flush  3 mL Intravenous Q12H  . spironolactone  12.5 mg Oral Daily   Continuous Infusions: . sodium chloride    . sodium chloride Stopped (07/15/18 1758)  . sodium chloride    . sodium chloride 1 mL/kg/hr (07/17/18 0300)  . amiodarone 30 mg/hr (07/17/18 0600)  . cefTRIAXone (ROCEPHIN)  IV Stopped (07/16/18 1712)  . heparin 1,700 Units/hr (07/17/18 0600)   PRN Meds: sodium chloride, sodium chloride, acetaminophen, fentaNYL, guaiFENesin-dextromethorphan, Heparin (Porcine) in NaCl, Heparin (Porcine) in NaCl, iohexol, lidocaine (PF), midazolam, nitroGLYCERIN, ondansetron (ZOFRAN) IV, sodium chloride flush, sodium chloride flush, sodium chloride flush   Vital Signs    Vitals:   07/17/18 0004 07/17/18 0331 07/17/18 0400 07/17/18 0528  BP: 97/72  94/69    Pulse: (!) 130  (!) 125   Resp: (!) 27  20   Temp: 99 F (37.2 C) 98.7 F (37.1 C)    TempSrc: Oral Oral    SpO2: 92%  97%   Weight:    73.6 kg  Height:        Intake/Output Summary (Last 24 hours) at 07/17/2018 0823 Last data filed at 07/17/2018 0600 Gross per 24 hour  Intake 1451.98 ml  Output 900 ml  Net 551.98 ml   Last 3 Weights 07/17/2018 07/16/2018 07/15/2018  Weight (lbs) 162 lb 4.1 oz 157 lb 13.6 oz 155 lb 10.3 oz  Weight (kg) 73.6 kg 71.6 kg 70.6 kg      Telemetry    Atrial flutter rate 123 - Personally Reviewed  ECG    No new - Personally Reviewed  Physical Exam   GEN: No acute distress. Though angry.  Neck: + JVD Cardiac: RRR, in a flutter with HR 123 no murmurs, rubs, or gallops.  Respiratory: Clear to auscultation bilaterally from ant position, cath Rt groin. GI: Soft, nontender, non-distended  MS: No edema; No deformity. Neuro:  Nonfocal  Psych: Normal affect   Labs    Chemistry Recent Labs  Lab 07/14/18 1606 07/15/18 0355 07/15/18 1057 07/16/18 0430 07/17/18 0152  NA 136 135  --  132*  133*  K 4.7 5.2*  --  4.4 3.5  CL 104 99  --  97* 100  CO2 19* 13*  --  21* 21*  GLUCOSE 316* 347*  --  280* 152*  BUN 34* 41*  --  31* 23  CREATININE 1.53* 1.76*  --  1.11 1.02  CALCIUM 8.3* 8.4*  --  7.6* 8.0*  PROT 6.8  --  6.1*  --   --   ALBUMIN 3.3*  --  2.9*  --   --   AST 125*  --  86*  --   --   ALT 55*  --  46*  --   --   ALKPHOS 63  --  59  --   --   BILITOT 1.1  --  2.4*  --   --   GFRNONAA 48* 41*  --  >60 >60  GFRAA 56* 47*  --  >60 >60  ANIONGAP 13 23*  --  14 12     Hematology Recent Labs  Lab 07/14/18 1606 07/15/18 0355 07/16/18 0430  WBC 19.1* 22.9* 12.8*  RBC 5.25 5.23 4.43  HGB 15.2 15.0 12.9*  HCT 47.3 47.8 39.6  MCV 90.1 91.4 89.4  MCH 29.0 28.7 29.1  MCHC 32.1 31.4 32.6  RDW 13.0 13.0 13.2  PLT 323 331 238    Cardiac Enzymes Recent Labs  Lab 07/14/18 1606 07/14/18 2127 07/15/18 0355  TROPONINI 22.10* 18.09*  16.39*   No results for input(s): TROPIPOC in the last 168 hours.   BNP Recent Labs  Lab 07/14/18 1606  BNP 1,618.1*     DDimer No results for input(s): DDIMER in the last 168 hours.   Radiology    Dg Chest Port 1 View  Result Date: 07/15/2018 CLINICAL DATA:  Follow-up PICC line EXAM: PORTABLE CHEST 1 VIEW COMPARISON:  07/15/2018 FINDINGS: New right-sided PICC line is noted with the catheter tip just below the cavoatrial junction. Cardiac shadow is stable. Postoperative changes are seen. Improved aeration is noted with slight decrease in the degree of vascular congestion. Left retrocardiac atelectasis is again seen. IMPRESSION: New right-sided PICC line just below the cavoatrial junction. Persistent left basilar atelectasis. Improvement in the degree of vascular congestion and edema. Electronically Signed   By: Alcide Clever M.D.   On: 07/15/2018 09:12    Cardiac Studies   Echo 07/14/18:  Dilated left ventricle - moderate  Severely decreased left ventricular systolic function, ejection fraction  23%  Segmental wall motion abnormality - (anterolateral, inferior and  inferolateral)  Diastolic dysfunction - grade I (normal filling pressures)  Normal right ventricular systolic function  Aortic sclerosis  Mitral regurgitation - mild  Cardiac Cath 07/17/18    Ost LAD to Prox LAD lesion is 100% stenosed.  Ost 1st Mrg to 1st Mrg lesion is 100% stenosed.  Ost 2nd Mrg to 2nd Mrg lesion is 100% stenosed.  Ost 3rd Mrg to 3rd Mrg lesion is 80% stenosed.  Prox Cx to Mid Cx lesion is 50% stenosed.  Prox RCA lesion is 70% stenosed.  Mid RCA lesion is 80% stenosed.  Dist RCA lesion is 60% stenosed.  Ost RPDA to RPDA lesion is 100% stenosed.  LIMA graft was visualized by angiography and is large.  The graft exhibits no disease.  Seq SVG- first diagonal, OM1, OM2 graft was visualized by angiography.  Origin to Prox Graft lesion before 1st Diag is 100% stenosed.  SVG graft  was visualized by angiography.  Origin lesion is 100%  stenosed.  SVG graft was visualized by angiography.  Origin lesion is 100% stenosed.  LV end diastolic pressure is mildly elevated.  Hemodynamic findings consistent with moderate pulmonary hypertension.   1. Severe 3 vessel occlusive CAD    - 100% proximal LAD    - 100% ostial OM 1 and OM 2    - 80% OM 3. This is a fairly small branch    - Diffuse RCA disease with long segmental 70% proximal and 80% mid vessel disease. 100% PDA that fills by left to right collaterals. 2. Patent LIMA to the LAD 3. Occluded SVG sequential to the first diagonal, OM1, and OM2. This appears to be recent 4. Occluded SVG to the second diagonal. The first and second diagonal fill from the LAD via the LIMA 5. Occluded SVG to PDA. This fills by left to right collaterals. 6. Mildly elevated LV filling pressures 7. Moderate pulmonary HTN 8. Markedly reduced cardiac output. Index 1.58. 9. Patient was in atrial flutter with HR 130 bpm during this procedure.   Diagnostic  Dominance: Right    Intervention     Patient Profile     63 y.o. male with CAD s/p CABG (12/2007, LIMA-LAD, SVG to D1/OM1/OM2, SVG to D2, SVG to PDA), HTN, HLD, and DM2presented to [facility] with N &V and in a fib with RVR. dilt drip and DCCV , elevated troponins to 2.44. EF to 20-25%, CXR with lt pl effusion and consolidation suggesting PNA. IV rocephin given. Neg CT abd. WBC 22, elevated LFTs, Pro BNP 6612, Cr 1.51 --anticoagulated with xarelto.   Pt with dry cough for last 3 weeks and exertional chest pain. His father had been ill for over 1 week and pt was notified of his father's death in transit to [facility].   Assessment & Plan    A fib/flutter with RVR with s/p TEE DCCV at [facility]  --was in and out SR and flutter now more a flutter.  Has been on amiodarone drip and numerous boluses of 150 mg.  Also on dig.   --will ask EP to see for possible ablation  NSTEMI  pk troponin 22  --abnormal EKG with ST depression ant leads but on Echo combination wall motion abnormality.   --on ASA statin BB --Cath with 3 VG down and patent LIMA to LAD  Hx of CAD with CABG 2009 (LIMA-LAD, SVG to D1/OM1/OM2, SVG to D2, SVG to PDA)  Ischemic Cardiomyopathywithnew acute systolic HF --EF 38-18% --BNP 2993   AKI  pk Cr of 1.76 now 1.02  DM-2  --glucose up to 317   --increased lantus yesterday will add insulin with meals and change to [facility] SSI   HLD  On high dose statin.   Grief/Anger  Father's funeral in 2 hours and with visitor restrictions due to Coronavirus  Add xanax if needed   For questions or updates, please contact [facility] Please consult [email or web address] for contact info under        Signed, Nada Boozer, NP  07/17/2018, 8:23 AM    Patient seen and examined with the above-signed Advanced Practice Provider and/or Housestaff. I personally reviewed laboratory data, imaging studies and relevant notes. I independently examined the patient and formulated the important aspects of the plan. I have edited the note to reflect any of my changes or salient points. I have personally discussed the plan with the patient and/or family.  Difficult situation. He feels relatively well but remains in AFL with RVR despite extensive amio  load (as in/out for a day or so but now maintaining AFL)   Cath today with occluded SVGs and patent LIMA (Occlusion of SVG to D1, D2 felt to be recent). No PCI options. RHC with normal filling pressures but markedly depressed CO. EF ~25%  Will ask EP to see regarding possible AFL ablation .   Will continue spiro and digoxin. BP too soft for ARB. No b-blocker with shock. Will defer inotropes for now but may need. Hopefully outputs will improve with restoration of NSR but may need VAD consideration in future.   He is now quite upset because he is missing his father's funeral and his wife is not allowed to visit.  He is wanting to  leave or be transferred to [facility]. Will await to see EP plan and make further arrangements.  Arvilla Meres, MD  11:10 AM

## 2018-07-17 NOTE — CV Procedure (Signed)
2 x 6  Fr. And a 8 Fr, . Sheaths were removed manualy by Vernona Rieger RN, and pressure was held for 10 min. Sterile gauze was applied at the site. L groin is soft and non tender. Bed rest started at 1520 X 6 hr. Instructions were given to the patient about his bed rest.  HR 88 NSR BP 128/71 sPO2 94 % on R/A

## 2018-07-17 NOTE — Progress Notes (Signed)
Transported to cardiac cath by bed awake and alert.

## 2018-07-18 DIAGNOSIS — E119 Type 2 diabetes mellitus without complications: Secondary | ICD-10-CM

## 2018-07-18 DIAGNOSIS — F4321 Adjustment disorder with depressed mood: Secondary | ICD-10-CM

## 2018-07-18 DIAGNOSIS — I5043 Acute on chronic combined systolic (congestive) and diastolic (congestive) heart failure: Secondary | ICD-10-CM | POA: Diagnosis present

## 2018-07-18 LAB — BASIC METABOLIC PANEL
Anion gap: 11 (ref 5–15)
BUN: 20 mg/dL (ref 8–23)
CO2: 19 mmol/L — ABNORMAL LOW (ref 22–32)
Calcium: 7.9 mg/dL — ABNORMAL LOW (ref 8.9–10.3)
Chloride: 101 mmol/L (ref 98–111)
Creatinine, Ser: 1.02 mg/dL (ref 0.61–1.24)
GFR calc Af Amer: >60 mL/min (ref >60)
GFR calc non Af Amer: >60 mL/min (ref >60)
Glucose, Bld: 174 mg/dL — ABNORMAL HIGH (ref 70–99)
Potassium: 3.8 mmol/L (ref 3.5–5.1)
SODIUM: 131 mmol/L — AB (ref 135–145)

## 2018-07-18 LAB — CBC
HCT: 36.4 % — ABNORMAL LOW (ref 39.0–52.0)
Hemoglobin: 12.4 g/dL — ABNORMAL LOW (ref 13.0–17.0)
MCH: 29.8 pg (ref 26.0–34.0)
MCHC: 34.1 g/dL (ref 30.0–36.0)
MCV: 87.5 fL (ref 80.0–100.0)
Platelets: 291 10*3/uL (ref 150–400)
RBC: 4.16 MIL/uL — ABNORMAL LOW (ref 4.22–5.81)
RDW: 13 % (ref 11.5–15.5)
WBC: 12.5 10*3/uL — ABNORMAL HIGH (ref 4.0–10.5)
nRBC: 0 % (ref 0.0–0.2)

## 2018-07-18 LAB — GLUCOSE, CAPILLARY
Glucose-Capillary: 147 mg/dL — ABNORMAL HIGH (ref 70–99)
Glucose-Capillary: 174 mg/dL — ABNORMAL HIGH (ref 70–99)
Glucose-Capillary: 221 mg/dL — ABNORMAL HIGH (ref 70–99)

## 2018-07-18 LAB — APTT: aPTT: 29 s (ref 24–36)

## 2018-07-18 LAB — COOXEMETRY PANEL
CARBOXYHEMOGLOBIN: 1.3 % (ref 0.5–1.5)
Carboxyhemoglobin: 1.4 % (ref 0.5–1.5)
METHEMOGLOBIN: 1.6 % — AB (ref 0.0–1.5)
Methemoglobin: 1.8 % — ABNORMAL HIGH (ref 0.0–1.5)
O2 Saturation: 66 %
O2 Saturation: 56.7 %
Total hemoglobin: 6.8 g/dL — CL (ref 12.0–16.0)
Total hemoglobin: 13.1 g/dL (ref 12.0–16.0)

## 2018-07-18 MED ORDER — AMIODARONE HCL 200 MG PO TABS: 200.0000 mg | ORAL_TABLET | Freq: Two times a day (BID) | ORAL | 0 refills | Status: DC

## 2018-07-18 MED ORDER — SERTRALINE HCL 50 MG PO TABS: 50.0000 mg | ORAL_TABLET | Freq: Every day | ORAL | 0 refills | Status: DC

## 2018-07-18 MED ORDER — APIXABAN 5 MG PO TABS: 5.0000 mg | ORAL_TABLET | Freq: Two times a day (BID) | ORAL | 3 refills | Status: DC

## 2018-07-18 MED ORDER — AMIODARONE HCL 200 MG PO TABS: 200.0000 mg | ORAL_TABLET | Freq: Every day | ORAL | 2 refills | Status: DC

## 2018-07-18 MED ORDER — NITROGLYCERIN 0.4 MG SL SUBL: 0.4000 mg | SUBLINGUAL_TABLET | SUBLINGUAL | 6 refills | Status: DC | PRN

## 2018-07-18 MED ORDER — DIGOXIN 125 MCG PO TABS: 0.1250 mg | ORAL_TABLET | Freq: Every day | ORAL | 2 refills | Status: DC

## 2018-07-18 MED ORDER — ASPIRIN 81 MG PO TBEC: 81.0000 mg | DELAYED_RELEASE_TABLET | Freq: Every day | ORAL | Status: DC

## 2018-07-18 MED ORDER — FUROSEMIDE 20 MG PO TABS: 20.0000 mg | ORAL_TABLET | Freq: Every day | ORAL | 3 refills | Status: AC

## 2018-07-18 MED ORDER — AMIODARONE HCL 200 MG PO TABS: 200.0000 mg | ORAL_TABLET | Freq: Two times a day (BID) | ORAL | 2 refills | Status: DC

## 2018-07-18 MED ORDER — ATORVASTATIN CALCIUM 80 MG PO TABS: 80.0000 mg | ORAL_TABLET | Freq: Every evening | ORAL | 2 refills | Status: DC

## 2018-07-18 MED ORDER — SPIRONOLACTONE 25 MG PO TABS: 25.0000 mg | ORAL_TABLET | Freq: Every day | ORAL | 2 refills | Status: DC

## 2018-07-18 NOTE — Progress Notes (Signed)
RUE PICC d/c per order.  Site without signs of infection or bleeding noted.  Instructed to stay in bed 30 minutes, signs and interventions for infection or bleeding via teachback method.  Wife at bedside.

## 2018-07-18 NOTE — Progress Notes (Signed)
Dr Bensimhon's note is reviewed.  Pt discussed with Marjie Skiff.   EKG reveals sinus rhythm post ablation.  DC to home. Continue eliquis without interruption. Routine groin care. Amiodarone 200mg  BID x 2 weeks then 200mg  daily  Follow-up with CHF team as planned Dr [name] would like to see in [place] office in 2 months  Hillis Range MD, [facility] 07/18/2018 11:10 AM

## 2018-07-18 NOTE — Progress Notes (Signed)
Progress Note  Patient Name: [name] Date of Encounter: 07/18/2018  Primary Cardiologist: [name], MD   Subjective   R/L cath yesterday. With occluded SVGs and patent LIMA (SVG to D1,D2 looked recent). RHC with normal filling pressures and low output.   Developed recurrent shock yesterday in the setting of persistent AFL with RVR. Underwent successful AFL ablation.   Now feeling much better. Denies CP or SOB. Maintaining NSR.  Says he is depressed being here and absolutely needs to go home today  Inpatient Medications    Scheduled Meds: . amiodarone  200 mg Oral BID  . apixaban  5 mg Oral BID  . aspirin EC  81 mg Oral Daily  . atorvastatin  80 mg Oral QPM  . azithromycin  500 mg Oral q1800  . Chlorhexidine Gluconate Cloth  6 each Topical Daily  . digoxin  0.125 mg Oral Daily  . insulin aspart  0-15 Units Subcutaneous TID AC & HS  . insulin aspart  3 Units Subcutaneous TID WC  . insulin glargine  20 Units Subcutaneous Daily  . loratadine  10 mg Oral Daily  . pantoprazole  40 mg Oral Daily  . potassium chloride  20 mEq Oral Once  . ranolazine  500 mg Oral BID  . sodium chloride flush  10-40 mL Intracatheter Q12H  . sodium chloride flush  3 mL Intravenous Q12H  . sodium chloride flush  3 mL Intravenous Q12H  . spironolactone  12.5 mg Oral Daily   Continuous Infusions: . sodium chloride    . sodium chloride    . cefTRIAXone (ROCEPHIN)  IV 1 g (07/17/18 1628)   PRN Meds: sodium chloride, sodium chloride, acetaminophen, ALPRAZolam, guaiFENesin-dextromethorphan, nitroGLYCERIN, ondansetron (ZOFRAN) IV, sodium chloride flush, sodium chloride flush, sodium chloride flush   Vital Signs    Vitals:   07/17/18 2200 07/18/18 0013 07/18/18 0403 07/18/18 0521  BP:  124/76 95/64   Pulse: 93 99 93   Resp: (!) 33 (!) 26 (!) 33   Temp:  98.6 F (37 C) 98.4 F (36.9 C)   TempSrc:  Oral Oral   SpO2: 90% 94% 100%   Weight:    74.1 kg  Height:         Intake/Output Summary (Last 24 hours) at 07/18/2018 0716 Last data filed at 07/18/2018 0432 Gross per 24 hour  Intake 154.97 ml  Output 400 ml  Net -245.03 ml   Last 3 Weights 07/18/2018 07/17/2018 07/16/2018  Weight (lbs) 163 lb 5.8 oz 162 lb 4.1 oz 157 lb 13.6 oz  Weight (kg) 74.1 kg 73.6 kg 71.6 kg      Telemetry    NSR 80-90s - Personally Reviewed  ECG    No new - Personally Reviewed  Physical Exam   General:  Well appearing. No resp difficulty HEENT: normal Neck: supple. no JVD. Carotids 2+ bilat; no bruits. No lymphadenopathy or thryomegaly appreciated. Cor: PMI nondisplaced. Regular rate & rhythm. No rubs, gallops or murmurs. Lungs: clear Abdomen: soft, nontender, nondistended. No hepatosplenomegaly. No bruits or masses. Good bowel sounds. Extremities: no cyanosis, clubbing, rash, edema Neuro: alert & orientedx3, cranial nerves grossly intact. moves all 4 extremities w/o difficulty. Affect pleasant   Labs    Chemistry Recent Labs  Lab 07/14/18 1606  07/15/18 1057 07/16/18 0430 07/17/18 0152 07/18/18 0403  NA 136   < >  --  132* 133* 131*  K 4.7   < >  --  4.4 3.5 3.8  CL  104   < >  --  97* 100 101  CO2 19*   < >  --  21* 21* 19*  GLUCOSE 316*   < >  --  280* 152* 174*  BUN 34*   < >  --  31* 23 20  CREATININE 1.53*   < >  --  1.11 1.02 1.02  CALCIUM 8.3*   < >  --  7.6* 8.0* 7.9*  PROT 6.8  --  6.1*  --   --   --   ALBUMIN 3.3*  --  2.9*  --   --   --   AST 125*  --  86*  --   --   --   ALT 55*  --  46*  --   --   --   ALKPHOS 63  --  59  --   --   --   BILITOT 1.1  --  2.4*  --   --   --   GFRNONAA 48*   < >  --  >60 >60 >60  GFRAA 56*   < >  --  >60 >60 >60  ANIONGAP 13   < >  --  14 12 11    < > = values in this interval not displayed.     Hematology Recent Labs  Lab 07/14/18 1606 07/15/18 0355 07/16/18 0430  WBC 19.1* 22.9* 12.8*  RBC 5.25 5.23 4.43  HGB 15.2 15.0 12.9*  HCT 47.3 47.8 39.6  MCV 90.1 91.4 89.4  MCH 29.0 28.7 29.1  MCHC  32.1 31.4 32.6  RDW 13.0 13.0 13.2  PLT 323 331 238    Cardiac Enzymes Recent Labs  Lab 07/14/18 1606 07/14/18 2127 07/15/18 0355  TROPONINI 22.10* 18.09* 16.39*   No results for input(s): TROPIPOC in the last 168 hours.   BNP Recent Labs  Lab 07/14/18 1606  BNP 1,618.1*     DDimer No results for input(s): DDIMER in the last 168 hours.   Radiology    No results found.  Cardiac Studies   Echo 07/14/18:  Dilated left ventricle - moderate  Severely decreased left ventricular systolic function, ejection fraction  23%  Segmental wall motion abnormality - (anterolateral, inferior and  inferolateral)  Diastolic dysfunction - grade I (normal filling pressures)  Normal right ventricular systolic function  Aortic sclerosis  Mitral regurgitation - mild  Cardiac Cath 07/17/18    Ost LAD to Prox LAD lesion is 100% stenosed.  Ost 1st Mrg to 1st Mrg lesion is 100% stenosed.  Ost 2nd Mrg to 2nd Mrg lesion is 100% stenosed.  Ost 3rd Mrg to 3rd Mrg lesion is 80% stenosed.  Prox Cx to Mid Cx lesion is 50% stenosed.  Prox RCA lesion is 70% stenosed.  Mid RCA lesion is 80% stenosed.  Dist RCA lesion is 60% stenosed.  Ost RPDA to RPDA lesion is 100% stenosed.  LIMA graft was visualized by angiography and is large.  The graft exhibits no disease.  Seq SVG- first diagonal, OM1, OM2 graft was visualized by angiography.  Origin to Prox Graft lesion before 1st Diag is 100% stenosed.  SVG graft was visualized by angiography.  Origin lesion is 100% stenosed.  SVG graft was visualized by angiography.  Origin lesion is 100% stenosed.  LV end diastolic pressure is mildly elevated.  Hemodynamic findings consistent with moderate pulmonary hypertension.   1. Severe 3 vessel occlusive CAD    - 100% proximal LAD    - 100%  ostial OM 1 and OM 2    - 80% OM 3. This is a fairly small branch    - Diffuse RCA disease with long segmental 70% proximal and 80% mid vessel  disease. 100% PDA that fills by left to right collaterals. 2. Patent LIMA to the LAD 3. Occluded SVG sequential to the first diagonal, OM1, and OM2. This appears to be recent 4. Occluded SVG to the second diagonal. The first and second diagonal fill from the LAD via the LIMA 5. Occluded SVG to PDA. This fills by left to right collaterals. 6. Mildly elevated LV filling pressures 7. Moderate pulmonary HTN 8. Markedly reduced cardiac output. Index 1.58. 9. Patient was in atrial flutter with HR 130 bpm during this procedure.   Diagnostic  Dominance: Right    Intervention     Patient Profile     63 y.o. male with CAD s/p CABG (12/2007, LIMA-LAD, SVG to D1/OM1/OM2, SVG to D2, SVG to PDA), HTN, HLD, and DM2presented to [facility] with N &V and in a fib with RVR. dilt drip and DCCV , elevated troponins to 2.44. EF to 20-25%, CXR with lt pl effusion and consolidation suggesting PNA. IV rocephin given. Neg CT abd. WBC 22, elevated LFTs, Pro BNP 6612, Cr 1.51 --anticoagulated with xarelto.   Pt with dry cough for last 3 weeks and exertional chest pain. His father had been ill for over 1 week and pt was notified of his father's death in transit to [facility].   Assessment & Plan    1. Acute systolic HF in setting of NSTEMI - EF 25% due to iCM (new) - RHC on 3/27 with normal filling pressures but low output in setting of AFL with RVR. Now seems improved.  - CVP 8-9 this am. Co-ox 66% (? Accurate) Will repeat  - Is adamant he needs to go home today and refuses to stay. Will do our best to optimize HF meds prior to d/c today  2. A fib/flutter with RVR with s/p TEE DCCV at [facility]  - Has both AFib anf AFL - AF well controlled on amio - AFL resolved after ablation - Start ELiquis. Continue amio   3. CAD s/p CABG 2009 (LIMA-LAD, SVG to D1/OM1/OM2, SVG to D2, SVG to PDA) now with NSTEMI pk troponin 22  --on ASA statin BB --Cath with 3 VG down and patent LIMA to LAD. No PCI options   4. AKI  pk Cr of 1.76 resolved  5. DM-2  -- glucose high. Insulin adjusted HgbA1c 7.5 -- continue home regimen including invokana as outpatient   6. HL - On high dose statin.   7. 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 URI - on ceftriaxone and azithro. Now AF can stop    Home cardiac meds - Eliquis 5 bid - ECASA 81 daily  - Spiro 25 daily - Lasix 20 daily - Digoxin 0.125 daily - Atorva 80 daily - Amiodarone 200 bid - Sertraline 50 daily - Lisinopril 2.5 daily (on PTA) - can switch as outpatient  - Invokana - Glucovance  Stop metoprolol. Will start carvedilol as outpatient. Refer to CR in [facility]  Needs HF telemed f/u this week    For questions or updates, please contact [facility] Please consult [email or web address] for contact info under        Signed, Arvilla Meres, MD  07/18/2018, 7:16 AM

## 2018-07-18 NOTE — Discharge Summary (Addendum)
Discharge Summary    Patient ID: [name] MRN: [id number]; DOB: [birth year]  Admit date: 07/14/2018 Discharge date: 07/18/2018  Primary Care Provider: [name], MD  Primary Cardiologist: [name], MD  Primary Electrophysiologist:  None   Discharge Diagnoses    Principal Problem:   Acute on chronic combined systolic and diastolic CHF (congestive heart failure) (HCC) Active Problems:   Hyperlipidemia   Essential hypertension, benign   CORONARY ATHEROSCLEROSIS NATIVE CORONARY ARTERY   NSTEMI (non-ST elevated myocardial infarction) (HCC)   Situational depression   Type 2 diabetes mellitus (HCC)   Allergies Allergies  Allergen Reactions   Furosemide Nausea Only    Diagnostic Studies/Procedures    Echocardiogram 07/14/2018: Impressions:  1. The left ventricle has severely reduced systolic function, with an ejection fraction of 25-30%. The cavity size was mildly dilated. There is mildly increased left ventricular wall thickness. Left ventricular diastolic Doppler parameters are  consistent with pseudonormalization. Elevated left ventricular end-diastolic pressure.  2. Severe akinesis of the left ventricular, basal-mid inferior wall and lateral wall.  3. Moderate pleural effusion in the left lateral region.  4. The pericardial effusion is posterior to the left ventricle.  5. Trivial pericardial effusion is present.  6. The mitral valve is degenerative. Mild thickening of the mitral valve leaflet. Mitral valve regurgitation is moderate by color flow Doppler.  7. The aortic valve is tricuspid Mild sclerosis of the aortic valve.  8. The ascending aorta and aortic root are normal in size and structure.  9. The inferior vena cava was normal in size with <50% respiratory variability. 10. The right ventricle has moderately reduced systolic function. The cavity was normal. There is no increase in right ventricular wall thickness. 11. The interatrial septum was not well  visualized. 12. The tricuspid valve is grossly normal.  Summary: LVEF 25-30%, mildly dilated LV, mild LVH, inferior and lateral severe hypokinesis to akinesis, grade 2 DD, moderate MR, normal biatrial size, moderate RV systolic dysfunction, RV pacer or AICD noted, IVC suggests elevated RA pressure of 8 mmHg _____________   Right/Left Heart Catheterization 07/17/2018:  Ost LAD to Prox LAD lesion is 100% stenosed.  Ost 1st Mrg to 1st Mrg lesion is 100% stenosed.  Ost 2nd Mrg to 2nd Mrg lesion is 100% stenosed.  Ost 3rd Mrg to 3rd Mrg lesion is 80% stenosed.  Prox Cx to Mid Cx lesion is 50% stenosed.  Prox RCA lesion is 70% stenosed.  Mid RCA lesion is 80% stenosed.  Dist RCA lesion is 60% stenosed.  Ost RPDA to RPDA lesion is 100% stenosed.  LIMA graft was visualized by angiography and is large.  The graft exhibits no disease.  Seq SVG- first diagonal, OM1, OM2 graft was visualized by angiography.  Origin to Prox Graft lesion before 1st Diag is 100% stenosed.  SVG graft was visualized by angiography.  Origin lesion is 100% stenosed.  SVG graft was visualized by angiography.  Origin lesion is 100% stenosed.  LV end diastolic pressure is mildly elevated.  Hemodynamic findings consistent with moderate pulmonary hypertension.   1. Severe 3 vessel occlusive CAD    - 100% proximal LAD    - 100% ostial OM 1 and OM 2    - 80% OM 3. This is a fairly small branch    - Diffuse RCA disease with long segmental 70% proximal and 80% mid vessel disease. 100% PDA that fills by left to right collaterals. 2. Patent LIMA to the LAD 3. Occluded SVG sequential to  the first diagonal, OM1, and OM2. This appears to be recent 4. Occluded SVG to the second diagonal. The first and second diagonal fill from the LAD via the LIMA 5. Occluded SVG to PDA. This fills by left to right collaterals. 6. Mildly elevated LV filling pressures. 7. Moderate pulmonary HTN 8. Markedly reduced cardiac  output. Index 1.58. 9. Patient was in atrial flutter with HR 130 bpm during this procedure.  _______________  Atrial Flutter Ablation: Conclusions: 1. Isthmus-dependent right atrial flutter upon presentation.  2. Successful radiofrequency ablation of atrial flutter along the cavotricuspid isthmus with complete bidirectional isthmus block achieved.  3. No inducible arrhythmias following ablation.  4. No early apparent complications.   History of Present Illness     [name] is a 63 year old male with a history of CAD s/p CABG in 12/2007 (LIMA-LAD, SVG to D1/OM1/OM2, SVG to D2, SVG to PDA), hypertension, hyperlipidemia, and type 2 diabetes mellitus who is followed by Dr. Diona BrownerMcDowell. Patient was last seen in clinic on 10/31/17 with Dr. Diona BrownerMcDowell. He was doing well at that time and was able to complete more than 4.0 METS. He was maintained on Aspirin, Lipitor, Lisinopril, and Lopressor.   [facility] 07/13/18 with nausea and vomiting for the past few weeks. He was found to be in atrial fibrillation with RVR (rates in the 150-160s). He was started on a Cardizem drip and admitted. He underwent a TEE/DCCV with successful cardioversion to normal sinus rhythm. His troponins were elevated: 0.97 >> 1.32 >> 2.27 >> 2.44. EKG prior to cardioversion showed atrial fibrillation with RVR and ST depression in precordial leads. EKG following cardioversion showed sinus rhythm with a TWI in V2 which resolved on subsequent tracings. TTE was performed and showed a reduced EF to 20-25%. Chest x-ray showed left pleural effusion and consolidation suggesting PNA. IV Rocephin was given x 1 dose. CT abdomen pelvis for nausea and vomiting largely negative for acute findings. Leukocytosis with WBC 22 and elevated liver enzymes. Lipase and Amylase were within normal limits. ProBNP elevated to 6612 up from 1805 the day before. Renal function impaired with serum creatinine of 1.51. He is anticoagulated  with Xarelto following cardioversion. Cardiology was unavailable today at outside hospital and requested transfer to [facility].  Of note, the patient's father had been ill for over 1 week. Police were dispatched to the father's residence for a welfare check and found him deceased. The sheriff's office called the patient and informed him while in transit to the hospital with [facility].   Per nursing, patient continues to have a cough on arrival. He is afebrile with BP 101/70. He remains in normal sinus rhythm in the low 100s. He has been placed on droplet precautions. Case discussed with DOD Dr. Excell Seltzerooper.   Patient describes dry cough for the past three weeks along with exertional chest pain relieved with rest and dyspnea on exertion.  Hospital Course     Consultants: Electrophysiology  Cardiomyopathy with New Acute Systolic Heart Failure BNP elevated at 1,618.1. Echocardiogram showed severely reduced LV systolic function with EF of 25-30 with mildly dilated LV, mild LVH, inferior and lateral severe hypokinesis to akinesis, grade 2 diastolic dysfunction, moderated RV systolic dysfunction, and elevated RA pressure of 8 mmHg. Patient was started on Spironolactone and Digoxin. PICC line was placed on 07/15/2018 to help with management. Right/left heart catheterization was performed on 07/17/2018 which showed normal filling pressure but markedly decreased cardiac output. BP has been too  soft to start ARB and home Lisinopril was held on admission. Home beta-blocker was held due to shock. Hopeful that outputs will improve with restoration of normal sinus rhythm, but per Dr. Kittie Plater may need VAD consideration in the future. PICC line removed today without any immediate complications. Patient very eager to go home. - Will start Lasix 20mg  daily.  - Continue Spironolactone 25mg  daily. - Continue Digoxin 0.125mg  daily. Renal function normal. Consider checking digoxin level at follow-up next week. - Continue  home Lisinopril 2.5mg  daily. Can consider restarting or switching to ARB/ARNI as an outpatient.  Atrial Fibrillation/Flutter with RVR Patient underwent TEE/DCCV at [facility] but noted to go in and out of atrial fibrillation/flutter on telemetry here. Patient was started on IV Amiodarone and PO Digoxin. He required numerous boluss of Amiodarone 150 mg due to persistent runs of atrial fibrillation/flutter with RVR. Patient remained in atrial flutter with RVR despite extensive Amiodarone load; therefore, EP was consulted and recommended EPS/ablation. Patient underwent successful radiogrequency ablation  Of atrial flutter along the cavotricuspid isthmus with complete bidirectional isthmus block achieved on 07/17/2018. Patient tolerated the procedure well and has maintained normal sinus rhythm since then. - Will start Eliquis 5mg  twice daily.  - Continue Amiodarone 200mg  twice daily for 2 weeks then transition to 200mg  daily.  NSTEMI with Known CAD EKG showed significant ST depression in anterolateral leads. Troponin peaked at 22 and then trended down. Patient underwent right/left heart catheterization on 07/17/2018 which showed occluded SVGs and patent LIMA (occlusion of SVG to D1, D2 felt to be recent). However, there were no PCI options. Therefore, medical management was recommended. Patient tolerated the procedure well. - Continue Aspirin 81mg  daily and Lipitor 80mg  daily.  - Will discontinue home Lopressor at this time and plan to start Coreg as an outpatient. - Will plan for cardiac rehab at [facility].  Hypertension - Patient has a history of hypertension and takes Lisinopril 2.5mg  daily and Lopressor 25mg  twice daily at home. However, patient BP has been soft at times during this admission. Patient developed recurrent shock on 07/17/2018 in the setting of persistent atrial flutter with RVR prior to ablation. BP improving since ablation. Most recent BP 111/72.  - Will continue home Lisinopril. This can  be switched as outpatient.  Hyperlipidemia Lipid panel this admission: Cholesterol 173, Triglycerides 85, HDL 51, LDL 105. LDL goal <70 given CAD. - Patient on Lipitor 20mg  daily at home. This was increased to 80mg  this admission. Will continue current dose at discharge.  Type 2 Diabetes Mellitus  Hemoglobin A1c 7.5 this admission. - Continue home regimen. - Will defer management to PCP.  AKI - Resolved Serum creatinine peaked at 1.76 this admission but has since resolved. Creatinine 1.02 today.  URI with Leukocytosis Patient presented with WBC of 22 and reported a dry cough x3 weeks. Initial [facility] x-[facility] at [facility] concerning for possible pneumonia. Dr. Excell Seltzer discussed patient case with Dr. Orvan Falconer (ID). They both agreed that patient was low risk for COVID-19 (no fever, leukopenia, travel, sick contacts, and alternative explanation for symptoms with acute CHF and elevated BNP). However, he has recommended a respiratory and flu panel both of which came back negative. Patient was started on Ceftriaxone and Azithromycin until clinical picture was clarified. WBC has improved throughout hospitalization and is 12.5 today. Patient will receive one more dose of both antibiotics prior to discharge.  Situational Depression in the Setting of MI and Father's Death Patient experienced signs of grief, depression, and anger throughout hospitalization.  Per Dr. Kittie Plater, will start Setraline  daily. Will prescribe a 30-day prescription with no refills. Patient will need to follow-up with PCP regarding this. If depression worsens or patient develops suicidal thoughts, patient advised to reach out to PCP.  Patient seen and examined by Dr. Gala Romney today and determined to be stable for discharge. Will plan for telehealth visit with the HF team next week (message sent to [facility]). Medications as below.  _____________  Discharge Vitals Blood pressure 111/72, pulse 93, temperature 99 F  (37.2 C), temperature source Oral, resp. rate (!) 33, height  (1.651 m), weight 74.1 kg, SpO2 100 %.  Filed Weights   07/16/18 0500 07/17/18 0528 07/18/18 0521  Weight: 71.6 kg 73.6 kg 74.1 kg    Labs & Radiologic Studies    CBC Recent Labs    07/16/18 0430 07/18/18 0725  WBC 12.8* 12.5*  HGB 12.9* 12.4*  HCT 39.6 36.4*  MCV 89.4 87.5  PLT 238 291   Basic Metabolic Panel Recent Labs    16/10/96 0152 07/18/18 0403  NA 133* 131*  K 3.5 3.8  CL 100 101  CO2 21* 19*  GLUCOSE 152* 174*  BUN 23 20  CREATININE 1.02 1.02  CALCIUM 8.0* 7.9*   Liver Function Tests No results for input(s): AST, ALT, ALKPHOS, BILITOT, PROT, ALBUMIN in the last 72 hours. No results for input(s): LIPASE, AMYLASE in the last 72 hours. Cardiac Enzymes No results for input(s): CKTOTAL, CKMB, CKMBINDEX, TROPONINI in the last 72 hours. BNP Invalid input(s): POCBNP D-Dimer No results for input(s): DDIMER in the last 72 hours. Hemoglobin A1C No results for input(s): HGBA1C in the last 72 hours. Fasting Lipid Panel No results for input(s): CHOL, HDL, LDLCALC, TRIG, CHOLHDL, LDLDIRECT in the last 72 hours. Thyroid Function Tests No results for input(s): TSH, T4TOTAL, T3FREE, THYROIDAB in the last 72 hours.  Invalid input(s): FREET3 _____________  Dg Chest Port 1 View  Result Date: 07/15/2018 CLINICAL DATA:  Follow-up PICC line EXAM: PORTABLE CHEST 1 VIEW COMPARISON:  07/15/2018 FINDINGS: New right-sided PICC line is noted with the catheter tip just below the cavoatrial junction. Cardiac shadow is stable. Postoperative changes are seen. Improved aeration is noted with slight decrease in the degree of vascular congestion. Left retrocardiac atelectasis is again seen. IMPRESSION: New right-sided PICC line just below the cavoatrial junction. Persistent left basilar atelectasis. Improvement in the degree of vascular congestion and edema. Electronically Signed   By: Alcide Clever M.D.   On: 07/15/2018  09:12   Dg Chest Port 1v Same Day  Result Date: 07/15/2018 CLINICAL DATA:  Chest pain.  Cardiac patient. EXAM: PORTABLE CHEST 1 VIEW COMPARISON:  07/14/2018; 07/13/2018 FINDINGS: Grossly unchanged enlarged cardiac silhouette and mediastinal contours post median sternotomy and CABG. Pulmonary vasculature appears less distinct on the present examination with cephalization of flow. Suspected slight increase in small layering bilateral effusions with associated worsening bibasilar heterogeneous/consolidative opacities, left greater than right. No pneumothorax. No acute osseous abnormalities. IMPRESSION: Suspected worsening pulmonary edema, now with small bilateral effusions and associated bibasilar opacities, left greater than right, atelectasis versus infiltrate. Electronically Signed   By: Simonne Come M.D.   On: 07/15/2018 07:46   [place] Ekg Site Rite  Result Date: 07/14/2018 If Site Rite image not attached, placement could not be confirmed due to current cardiac rhythm.  [place] Ekg Site Rite  Result Date: 07/14/2018 If [facility] image not attached, placement could not be confirmed due to current cardiac rhythm.  Disposition  Patient is being discharged home today in good condition.  Follow-up Plans & Appointments    Follow-up Information    [name], MD Follow up.   Specialty:  Cardiology Why:  08/12/2018 @ 11:00AM (this is currently scheduled as post procedure telephone visit)  Contact information: [address] [place] [address] [phone]        [name], MD. Call.   Specialty:  Internal Medicine Why:  Please follow-up with your PCP in the next 1-2 weeks. Contact information: [address] [place] [address] 336 [phone]        [facility] Follow up.   Specialty:  Cardiology Why:  We would like to follow-up with you next week. Our office will call you with a telehealth appointment time. Contact  information: [address] [id number] [place] [address] [phone]         Discharge Instructions    Diet - low sodium heart healthy   Complete by:  As directed    Increase activity slowly   Complete by:  As directed       Discharge Medications   Allergies as of 07/18/2018      Reactions   Furosemide Nausea Only      Medication List    STOP taking these medications   amoxicillin 500 MG capsule Commonly known as:  AMOXIL   aspirin 81 MG tablet Replaced by:  aspirin 81 MG EC tablet   metoprolol tartrate 25 MG tablet Commonly known as:  LOPRESSOR     TAKE these medications   amiodarone 200 MG tablet Commonly known as:  PACERONE Take 1 tablet (200 mg total) by mouth 2 (two) times daily. Take  twice daily for 2 weeks and then transition to  daily.   amiodarone 200 MG tablet Commonly known as:  Pacerone Take 1 tablet (200 mg total) by mouth daily. Start taking on:  August 01, 2018   apixaban 5 MG Tabs tablet Commonly known as:  ELIQUIS Take 1 tablet (5 mg total) by mouth 2 (two) times daily.   aspirin 81 MG EC tablet Take 1 tablet (81 mg total) by mouth daily. Start taking on:  July 19, 2018 Replaces:  aspirin 81 MG tablet   atorvastatin 80 MG tablet Commonly known as:  LIPITOR Take 1 tablet (80 mg total) by mouth every evening. What changed:    medication strength  how much to take   digoxin 0.125 MG tablet Commonly known as:  LANOXIN Take 1 tablet (0.125 mg total) by mouth daily. Start taking on:  July 19, 2018   fexofenadine 180 MG tablet Commonly known as:  ALLEGRA Take 180 mg by mouth as needed for allergies or rhinitis.   Fish Oil 1000 MG Caps Take 1,000 mg by mouth 2 (two) times daily.   furosemide 20 MG tablet Commonly known as:  Lasix Take 1 tablet (20 mg total) by mouth daily.   glyBURIDE-metformin 5-500 MG tablet Commonly known as:  GLUCOVANCE Take 1 tablet by mouth 2 (two) times daily.     Invokana 100 MG Tabs tablet Generic drug:  canagliflozin Take 100 mg by mouth daily before breakfast.   lisinopril 5 MG tablet Commonly known as:  PRINIVIL,ZESTRIL Take 2.5 mg by mouth daily.   nitroGLYCERIN 0.4 MG SL tablet Commonly known as:  NITROSTAT Place 1 tablet (0.4 mg total) under the tongue every 5 (five) minutes x 3 doses as needed  for chest pain.   pantoprazole 40 MG tablet Commonly known as:  PROTONIX Take 40 mg by mouth daily.   sertraline 50 MG tablet Commonly known as:  Zoloft Take 1 tablet (50 mg total) by mouth daily for 30 days.   spironolactone 25 MG tablet Commonly known as:  ALDACTONE Take 1 tablet (25 mg total) by mouth daily. Start taking on:  July 19, 2018        Acute coronary syndrome (MI, NSTEMI, STEMI, etc) this admission?: Yes.     AHA/ACC Clinical Performance & Quality Measures: 1. Aspirin prescribed? - Yes 2. ADP Receptor Inhibitor (Plavix/Clopidogrel, Brilinta/Ticagrelor or Effient/Prasugrel) prescribed (includes medically managed patients)? - No - no PCI options and patient on Eliquis. 3. Beta Blocker prescribed? - No - due to acute systolic heart failure. Planning to start as outpatient. 4. High Intensity Statin (Lipitor 40-80mg  or Crestor 20-40mg ) prescribed? - Yes 5. EF assessed during THIS hospitalization? - Yes 6. For EF <40%, was ACEI/ARB prescribed? - No - Reason:  soft BP. Planning to start as outpatient. 7. For EF <40%, Aldosterone Antagonist (Spironolactone or Eplerenone) prescribed? - Yes 8. Cardiac Rehab Phase II ordered (Included Medically managed Patients)? - Yes     Outstanding Labs/Studies   Will need repeat lipid panel and CMP in 6 weeks after transitioning to high-intensity statin.   Duration of Discharge Encounter   Greater than 30 minutes including physician time.  Signed, Corrin Parker, PA-C 07/18/2018, 1:21 PM   Patient seen and examined with the above-signed Advanced Practice Provider and/or  Housestaff. I personally reviewed laboratory data, imaging studies and relevant notes. I independently examined the patient and formulated the important aspects of the plan. I have edited the note to reflect any of my changes or salient points. I have personally discussed the plan with the patient and/or family.  He is ready for d/c with close f/u in HF Clinic.   Arvilla Meres, MD  10:29 AM

## 2018-07-18 NOTE — Discharge Instructions (Addendum)
Post ABLATION Care/Activity Instructions No driving for 4 days. No lifting over 5 lbs for 1 week. No vigorous or sexual activity for 1 week. You may return to work on 06/23/2018 from a procedure standopoint. Keep procedure site clean & dry. If you notice increased pain, swelling, bleeding or pus, call/return!  You may shower, but no soaking baths/hot tubs/pools for 1 week.   Groin Site Care: Refer to this sheet in the next few weeks. These instructions provide you with information on caring for yourself after your procedure. Your caregiver may also give you more specific instructions. Your treatment has been planned according to current medical practices, but problems sometimes occur. Call your caregiver if you have any problems or questions after your procedure. HOME CARE INSTRUCTIONS  You may shower 24 hours after the procedure. Remove the bandage (dressing) and gently wash the site with plain soap and water. Gently pat the site dry.   Do not apply powder or lotion to the site.   Do not sit in a bathtub, swimming pool, or whirlpool for 5 to 7 days.   No bending, squatting, or lifting anything over 10 pounds (4.5 kg) as directed by your caregiver.   Inspect the site at least twice daily.   Do not drive home if you are discharged the same day of the procedure. Have someone else drive you.  What to expect:  Any bruising will usually fade within 1 to 2 weeks.   Blood that collects in the tissue (hematoma) may be painful to the touch. It should usually decrease in size and tenderness within 1 to 2 weeks.  SEEK IMMEDIATE MEDICAL CARE IF:  You have unusual pain at the groin site or down the affected leg.   You have redness, warmth, swelling, or pain at the groin site.   You have drainage (other than a small amount of blood on the dressing).   You have chills.   You have a fever or persistent symptoms for more than 72 hours.   You have a fever and your symptoms suddenly get worse.   Your  leg becomes pale, cool, tingly, or numb.   You have heavy bleeding from the site. Hold pressure on the site. .  _______________  Post NSTEMI NO HEAVY LIFTING X 2 WEEKS. NO SEXUAL ACTIVITY X 2 WEEKS. NO DRIVING X 1 WEEK. NO SOAKING BATHS, HOT TUBS, POOLS, ETC., X 7 DAYS.  Radial Site Care: Refer to this sheet in the next few weeks. These instructions provide you with information on caring for yourself after your procedure. Your caregiver may also give you more specific instructions. Your treatment has been planned according to current medical practices, but problems sometimes occur. Call your caregiver if you have any problems or questions after your procedure. HOME CARE INSTRUCTIONS  You may shower the day after the procedure.Remove the bandage (dressing) and gently wash the site with plain soap and water.Gently pat the site dry.   Do not apply powder or lotion to the site.   Do not submerge the affected site in water for 3 to 5 days.   Inspect the site at least twice daily.   Do not flex or bend the affected arm for 24 hours.   No lifting over 5 pounds (2.3 kg) for 5 days after your procedure.   Do not drive home if you are discharged the same day of the procedure. Have someone else drive you.   You may drive 24 hours after the procedure unless  otherwise instructed by your caregiver.  What to expect:  Any bruising will usually fade within 1 to 2 weeks.   Blood that collects in the tissue (hematoma) may be painful to the touch. It should usually decrease in size and tenderness within 1 to 2 weeks.  SEEK IMMEDIATE MEDICAL CARE IF:  You have unusual pain at the radial site.   You have redness, warmth, swelling, or pain at the radial site.   You have drainage (other than a small amount of blood on the dressing).   You have chills.   You have a fever or persistent symptoms for more than 72 hours.   You have a fever and your symptoms suddenly get worse.   Your arm  becomes pale, cool, tingly, or numb.   You have heavy bleeding from the site. Hold pressure on the site.   _______________  YOUR CARDIOLOGY TEAM HAS ARRANGED FOR AN E-VISIT FOR YOUR ELECTROPHYSIOLOGY AND HEART FAILURE APPOINTMENTS - PLEASE REVIEW IMPORTANT INFORMATION BELOW SEVERAL DAYS PRIOR TO YOUR APPOINTMENT  Due to the recent COVID-19 pandemic, we are transitioning in-person office visits to tele-medicine visits in an effort to decrease unnecessary exposure to our patients and staff. Medicare and most insurances are covering these visits without a copay needed. You will need a smartphone if possible. We also encourage you to sign up for MyChart. For patients that do not have this, we can still complete the visit using a regular telephone but do prefer a smartphone to enable video when possible. You may have a close family member that can help. If possible, we also ask that you have a blood pressure cuff and scale at home to measure your blood pressure, heart rate and weight prior to your scheduled appointment. Patients with clinical needs that need an in-person evaluation and testing will still be able to come to the office if absolutely necessary. If you have any questions, feel free to call our office.  IF YOU HAVE A SMARTPHONE, PLEASE DOWNLOAD THE WEBEX APP TO YOUR SMARTPHONE  - If Apple, go to [employer] and type in WebEx in the search bar. Download [employer], the blue/green circle. The app is free but as with any other app download, your phone may require you to verify saved payment information or Apple password. You do NOT have to create a WebEx account.  - If Android, go to [employer] and type in [employer] in the search bar. Download [employer], the blue/green circle. The app is free but as with any other app download, your phone may require you to verify saved payment information or Android password. You do NOT have to create a WebEx account.  It is very helpful to  have this downloaded before your visit.  2-3 DAYS BEFORE YOUR APPOINTMENT  You will receive a telephone call from one of our HeartCare team members - your caller ID may say "Unknown caller." If this is a video visit, we will confirm that you have been able to download the WebEx app. We will remind you check your blood pressure, heart rate and weight prior to your scheduled appointment. If you have an Apple Watch or Kardia, please upload any pertinent ECG strips the day before or morning of your appointment to MyChart. Our staff will also make sure you have reviewed the consent and agree to move forward with your scheduled tele-health visit.     THE DAY OF YOUR APPOINTMENT  Approximately 15 minutes prior to your  scheduled appointment, you will receive a telephone call from one of HeartCare team - your caller ID may say "Unknown caller."  Our staff will confirm medications, vital signs for the day and any symptoms you may be experiencing. Please have this information available prior to the time of visit start. It may also be helpful for you to have a pad of paper and pen handy for any instructions given during your visit. They will also walk you through joining the WebEx smartphone meeting if this is a video visit.    CONSENT FOR TELE-HEALTH VISIT - PLEASE RVIEW  I hereby voluntarily request, consent and authorize [facility] and its employed or contracted physicians, physician assistants, nurse practitioners or other licensed health care professionals (the Practitioner), to provide me with telemedicine health care services (the Services") as deemed necessary by the treating Practitioner. I acknowledge and consent to receive the Services by the Practitioner via telemedicine. I understand that the telemedicine visit will involve communicating with the Practitioner through live audiovisual communication technology and the disclosure of certain medical information by electronic transmission. I  acknowledge that I have been given the opportunity to request an in-person assessment or other available alternative prior to the telemedicine visit and am voluntarily participating in the telemedicine visit.  I understand that I have the right to withhold or withdraw my consent to the use of telemedicine in the course of my care at any time, without affecting my right to future care or treatment, and that the Practitioner or I may terminate the telemedicine visit at any time. I understand that I have the right to inspect all information obtained and/or recorded in the course of the telemedicine visit and may receive copies of available information for a reasonable fee.  I understand that some of the potential risks of receiving the Services via telemedicine include:   Delay or interruption in medical evaluation due to technological equipment failure or disruption;  Information transmitted may not be sufficient (e.g. poor resolution of images) to allow for appropriate medical decision making by the Practitioner; and/or   In rare instances, security protocols could fail, causing a breach of personal health information.  Furthermore, I acknowledge that it is my responsibility to provide information about my medical history, conditions and care that is complete and accurate to the best of my ability. I acknowledge that Practitioner's advice, recommendations, and/or decision may be based on factors not within their control, such as incomplete or inaccurate data provided by me or distortions of diagnostic images or specimens that may result from electronic transmissions. I understand that the practice of medicine is not an exact science and that Practitioner makes no warranties or guarantees regarding treatment outcomes. I acknowledge that I will receive a copy of this consent concurrently upon execution via email to the email address I last provided but may also request a printed copy by calling the office of  [facility].    I understand that my insurance will be billed for this visit.   I have read or had this consent read to me.  I understand the contents of this consent, which adequately explains the benefits and risks of the Services being provided via telemedicine.   I have been provided ample opportunity to ask questions regarding this consent and the Services and have had my questions answered to my satisfaction.  I give my informed consent for the services to be provided through the use of telemedicine in my medical care  By participating in  this telemedicine visit I agree to the above. _______________  Information on my medicine - ELIQUIS (apixaban) Why was Eliquis prescribed for you? Eliquis was prescribed for you to reduce the risk of a blood clot forming that can cause a stroke if you have a medical condition called atrial fibrillation (a type of irregular heartbeat).  What do You need to know about Eliquis ? Take your Eliquis TWICE DAILY - one tablet in the morning and one tablet in the evening with or without food. If you have difficulty swallowing the tablet whole please discuss with your pharmacist how to take the medication safely.  Take Eliquis exactly as prescribed by your doctor and DO NOT stop taking Eliquis without talking to the doctor who prescribed the medication.  Stopping may increase your risk of developing a stroke.  Refill your prescription before you run out.  After discharge, you should have regular check-up appointments with your healthcare provider that is prescribing your Eliquis.  In the future your dose may need to be changed if your kidney function or weight changes by a significant amount or as you get older.  What do you do if you miss a dose? If you miss a dose, take it as soon as you remember on the same day and resume taking twice daily.  Do not take more than one dose of ELIQUIS at the same time to make up a missed dose.  Important Safety  Information A possible side effect of Eliquis is bleeding. You should call your healthcare provider right away if you experience any of the following: ? Bleeding from an injury or your nose that does not stop. ? Unusual colored urine (red or dark brown) or unusual colored stools (red or black). ? Unusual bruising for unknown reasons. ? A serious fall or if you hit your head (even if there is no bleeding).  Some medicines may interact with Eliquis and might increase your risk of bleeding or clotting while on Eliquis. To help avoid this, consult your healthcare provider or pharmacist prior to using any new prescription or non-prescription medications, including herbals, vitamins, non-steroidal anti-inflammatory drugs (NSAIDs) and supplements.  This website has more information on Eliquis (apixaban): http://www.eliquis.com/eliquis/home

## 2018-07-19 ENCOUNTER — Other Ambulatory Visit: Payer: Self-pay | Admitting: Student

## 2018-07-20 ENCOUNTER — Encounter (HOSPITAL_COMMUNITY): Payer: Self-pay | Admitting: Internal Medicine

## 2018-07-20 LAB — POCT I-STAT 7, (LYTES, BLD GAS, ICA,H+H)
Acid-base deficit: 5 mmol/L — ABNORMAL HIGH (ref 0.0–2.0)
Bicarbonate: 18 mmol/L — ABNORMAL LOW (ref 20.0–28.0)
Calcium, Ion: 1.05 mmol/L — ABNORMAL LOW (ref 1.15–1.40)
HCT: 36 % — ABNORMAL LOW (ref 39.0–52.0)
Hemoglobin: 12.2 g/dL — ABNORMAL LOW (ref 13.0–17.0)
O2 Saturation: 92 %
Potassium: 4 mmol/L (ref 3.5–5.1)
Sodium: 132 mmol/L — ABNORMAL LOW (ref 135–145)
TCO2: 19 mmol/L — ABNORMAL LOW (ref 22–32)
pCO2 arterial: 28 mmHg — ABNORMAL LOW (ref 32.0–48.0)
pH, Arterial: 7.416 (ref 7.350–7.450)
pO2, Arterial: 60 mmHg — ABNORMAL LOW (ref 83.0–108.0)

## 2018-07-20 LAB — POCT I-STAT EG7
Acid-base deficit: 4 mmol/L — ABNORMAL HIGH (ref 0.0–2.0)
Bicarbonate: 19.8 mmol/L — ABNORMAL LOW (ref 20.0–28.0)
Calcium, Ion: 1.07 mmol/L — ABNORMAL LOW (ref 1.15–1.40)
HCT: 37 % — ABNORMAL LOW (ref 39.0–52.0)
Hemoglobin: 12.6 g/dL — ABNORMAL LOW (ref 13.0–17.0)
O2 Saturation: 44 %
PCO2 VEN: 32.3 mmHg — AB (ref 44.0–60.0)
Potassium: 4.1 mmol/L (ref 3.5–5.1)
Sodium: 132 mmol/L — ABNORMAL LOW (ref 135–145)
TCO2: 21 mmol/L — ABNORMAL LOW (ref 22–32)
pH, Ven: 7.395 (ref 7.250–7.430)
pO2, Ven: 24 mmHg — CL (ref 32.0–45.0)

## 2018-07-21 ENCOUNTER — Encounter (HOSPITAL_COMMUNITY): Payer: Self-pay | Admitting: Cardiology

## 2018-07-21 ENCOUNTER — Telehealth (HOSPITAL_COMMUNITY): Payer: Self-pay | Admitting: Cardiology

## 2018-07-21 NOTE — Telephone Encounter (Signed)
Consent AND WEB EX sent via mychart

## 2018-07-21 NOTE — Telephone Encounter (Signed)
-----   Message from Corrin Parker, [place] sent at 07/18/2018  9:26 AM EDT ----- Regarding: Hospital Follow-Up Hi [name],  Per Dr. Gala Romney, patient needs a televisit next week. Can you please schedule this and then contact patient with the appointment time.  Thank you! Callie

## 2018-07-21 NOTE — Telephone Encounter (Signed)
Virtual OV scheduled for 4/2 @ 130 Instructions given for my chart enrollment, daughter to assist with enrollment for patient.  Will need consent and webEX instructions once mychart is active.

## 2018-07-23 ENCOUNTER — Encounter (HOSPITAL_COMMUNITY): Payer: Self-pay

## 2018-07-23 ENCOUNTER — Other Ambulatory Visit: Payer: Self-pay

## 2018-07-23 ENCOUNTER — Ambulatory Visit (HOSPITAL_COMMUNITY)
Admission: RE | Admit: 2018-07-23 | Discharge: 2018-07-23 | Disposition: A | Discharge status: Home / Self Care | Payer: PRIVATE HEALTH INSURANCE | Source: Ambulatory Visit | Attending: Cardiology | Admitting: Cardiology

## 2018-07-23 DIAGNOSIS — I5022 Chronic systolic (congestive) heart failure: Secondary | ICD-10-CM | POA: Diagnosis not present

## 2018-07-23 DIAGNOSIS — Z8679 Personal history of other diseases of the circulatory system: Secondary | ICD-10-CM

## 2018-07-23 DIAGNOSIS — I4891 Unspecified atrial fibrillation: Secondary | ICD-10-CM | POA: Diagnosis not present

## 2018-07-23 DIAGNOSIS — I110 Hypertensive heart disease with heart failure: Secondary | ICD-10-CM

## 2018-07-23 DIAGNOSIS — Z7901 Long term (current) use of anticoagulants: Secondary | ICD-10-CM

## 2018-07-23 DIAGNOSIS — I2510 Atherosclerotic heart disease of native coronary artery without angina pectoris: Secondary | ICD-10-CM

## 2018-07-23 DIAGNOSIS — I255 Ischemic cardiomyopathy: Secondary | ICD-10-CM | POA: Diagnosis not present

## 2018-07-23 DIAGNOSIS — I252 Old myocardial infarction: Secondary | ICD-10-CM

## 2018-07-23 DIAGNOSIS — F329 Major depressive disorder, single episode, unspecified: Secondary | ICD-10-CM

## 2018-07-23 DIAGNOSIS — Z951 Presence of aortocoronary bypass graft: Secondary | ICD-10-CM

## 2018-07-23 MED ORDER — ASPIRIN EC 81 MG PO TBEC: 81.0000 mg | DELAYED_RELEASE_TABLET | Freq: Every day | ORAL | 0 refills | Status: DC

## 2018-07-23 MED ORDER — CARVEDILOL 3.125 MG PO TABS: 3.1250 mg | ORAL_TABLET | Freq: Two times a day (BID) | ORAL | 3 refills | Status: DC

## 2018-07-23 MED ORDER — ASPIRIN EC 81 MG PO TBEC: 81.0000 mg | DELAYED_RELEASE_TABLET | Freq: Every day | ORAL | 0 refills | Status: AC

## 2018-07-23 NOTE — Addendum Note (Signed)
Encounter addended by: Alford Highland, NP on: 07/23/2018 2:46 PM  Actions taken: Clinical Note Signed

## 2018-07-23 NOTE — Patient Instructions (Addendum)
You should be on ASPIRIN 81mg  (1 tab) daily for 30 days  START Coreg 3.125mg  (1 tab) twice a day. Monitor your Blood pressure and Heart Rate at home.  If you start to feel fatigue, dizzy, or loss of appetite, CALL OFFICE IMMEDIATELY.  A prescription for Amiodarone 200mg  (1 tab) daily to start on 08/01/18 is at your pharmacy already  Labs to be drawn at [facility], HOLD DIGOXIN before lab draw We will only contact you if something comes back abnormal or we need to make some changes. Otherwise no news is good news!  [facility]: [address], [place] [address]   Have them fax lab results to [phone]  Your physician recommends that you schedule a follow-up appointment in: You will be called to schedule a webex visit with Dr. Gala Romney.  Your physician recommends that you schedule a follow-up appointment in: 3 months with Dr Gala Romney and an ECHO, you will be called to schedule this appointment.  Your physician has requested that you have an echocardiogram. Echocardiography is a painless test that uses sound waves to create images of your heart. It provides your doctor with information about the size and shape of your heart and how well your heart's chambers and valves are working. This procedure takes approximately one hour. There are no restrictions for this procedure.

## 2018-07-23 NOTE — Progress Notes (Addendum)
Heart Failure TeleHealth Note  Due to national recommendations of social distancing due to COVID 19, Audio/video telehealth visit is felt to be most appropriate for this patient at this time.  See MyChart message from today for patient consent regarding telehealth for Kyle [name].  ID:  Kyle [name], DOB 1955/05/08, MRN [id number]  Location: Home  Provider location: [address] [place] Type of Visit: Established patient, [name] follow up  PCP:  Kyle [name], Kyle [name]  Cardiologist:  Kyle [name], Kyle [name] Primary HF: Kyle [name]  Chief Complaint: Chronic systolic HF, ICM   History of Present Illness: Kyle [name] is a 63 year old male with a history of CAD s/p CABG in 12/2007 (LIMA-LAD, SVG to D1/OM1/OM2, SVG to D2, SVG to PDA), hypertension, hyperlipidemia, and type 2 diabetes mellitus.  Recent admission 3/23-3/28/20 with exertional chest pain. Found to be in Afib RVR 150-160s. Started on diltiazem drip and underwent successful TEE/DCCV. Continued to have PAF, so started on amiodarone drip and ultimately underwent atrial flutter ablation. Transitioned to oral amiodarone and started on Eliquis. Noted to have elevated troponin with EKG changes on admission. LHC showed occluded SVGs and patent LIMA (occlusion of SVG to D1, D2 felt to be recent). No PCI options. Echo showed newly reduced EF 20-25%. RHC showed markedly low output with CI 1.58 (thought to be in setting of afib RVR). PICC line was placed and coox monitored closely. He did not require inotropes. HF meds were optimized. BB was stopped with low output. He was also treated with antibiotics for URI. Of note, pt's father died while patient was admitted. He was started on sertraline for depression. DC weight 163 lbs.   He presents via Web designer for a telehealth visit today. Overall doing fine. He is able to walk in yard and around the house with no SOB. Not very active yet. He has mild edema that improves  with moving around. No orthopnea or PND. No CP or dizziness. No further palpitations. He could feel prior to admission. No bleeding on Eliquis. Has some sinus drainage, which will cause a cough. Has occasional clear sputum.  No fever/chills/sweats. Appetite is improved. Still fatigued, but feels like he is getting stronger. Does not sleep well.  No problems getting medications. No missed doses.  Limiting salt intake. Does not drink much. They have a good supply of food. They are moving to [facility] soon.   BP: 100-120s/60-70s. 103/61 today. HR 75. Weights: 155-161 lbs, 155 lbs today.   Pt denies symptoms of cough, fevers, chills, or new SOB worrisome for COVID 19. No sick contacts.   Past Medical History:  Diagnosis Date  . Coronary atherosclerosis of native coronary artery    Multivessel, LVEF 60%  . Essential hypertension, benign   . Hyperlipidemia   . Type 2 diabetes mellitus (HCC)    Past Surgical History:  Procedure Laterality Date  . A-FLUTTER ABLATION N/A 07/17/2018   Procedure: A-FLUTTER ABLATION;  Surgeon: Kyle [name], Kyle [name];  Location: [facility] INVASIVE CV LAB;  Service: Cardiovascular;  Laterality: N/A;  . CORONARY ARTERY BYPASS GRAFT  09/09   LIMA to LAD, SVG to D1/OM1/OM2, SVG to D2, SVG to PDA  . RIGHT/LEFT HEART CATH AND CORONARY/GRAFT ANGIOGRAPHY N/A 07/17/2018   Procedure: RIGHT/LEFT HEART CATH AND CORONARY/GRAFT ANGIOGRAPHY;  Surgeon: Kyle [name], Kyle [name], Kyle [name];  Location: [facility] INVASIVE CV LAB;  Service: Cardiovascular;  Laterality: N/A;     Current Outpatient Medications  Medication Sig Dispense Refill  .  amiodarone (PACERONE) 200 MG tablet Take 1 tablet (200 mg total) by mouth 2 (two) times daily. Take 200mg  twice daily for 2 weeks and then transition to 200mg  daily. 27 tablet 0  . [START ON 08/01/2018] amiodarone (PACERONE) 200 MG tablet Take 1 tablet (200 mg total) by mouth daily. 30 tablet 2  . apixaban (ELIQUIS) 5 MG TABS tablet Take 1 tablet (5 mg total) by mouth 2 (two)  times daily. 60 tablet 3  . atorvastatin (LIPITOR) 80 MG tablet Take 1 tablet (80 mg total) by mouth every evening. 30 tablet 2  . digoxin (LANOXIN) 0.125 MG tablet Take 1 tablet (0.125 mg total) by mouth daily. 30 tablet 2  . fexofenadine (ALLEGRA) 180 MG tablet Take 180 mg by mouth as needed for allergies or rhinitis.    . furosemide (LASIX) 20 MG tablet Take 1 tablet (20 mg total) by mouth daily. 30 tablet 3  . glyBURIDE-metformin (GLUCOVANCE) 5-500 MG per tablet Take 1 tablet by mouth 2 (two) times daily.     . INVOKANA 100 MG TABS tablet Take 100 mg by mouth daily before breakfast.   12  . lisinopril (PRINIVIL,ZESTRIL) 5 MG tablet Take 2.5 mg by mouth daily.     . Omega-3 Fatty Acids (FISH OIL) 1000 MG CAPS Take 1,000 mg by mouth 2 (two) times daily.     . pantoprazole (PROTONIX) 40 MG tablet Take 40 mg by mouth daily.      . sertraline (ZOLOFT) 50 MG tablet Take 1 tablet (50 mg total) by mouth daily for 30 days. 30 tablet 0  . spironolactone (ALDACTONE) 25 MG tablet Take 1 tablet (25 mg total) by mouth daily. 30 tablet 2  . nitroGLYCERIN (NITROSTAT) 0.4 MG SL tablet Place 1 tablet (0.4 mg total) under the tongue every 5 (five) minutes x 3 doses as needed for chest pain. (Patient not taking: Reported on 07/23/2018) 30 tablet 6   No current facility-administered medications for this encounter.     Allergies:   Furosemide   Social History:  The patient  reports that he quit smoking about 35 years ago. His smoking use included cigarettes. He has never used smokeless tobacco. He reports that he does not drink alcohol or use drugs.   Family History:  The patient's family history includes Coronary artery disease in an other family member.   ROS:  Please see the history of present illness.   All other systems are personally reviewed and negative.   Exam:  [name] Call) Lungs: Normal respiratory effort with conversation.  Neuro: Alert & oriented x 3.  General: Well appearing. No resp  difficulty. HEENT: Normal Neck: Supple.  Extremities: R and LLE no edema.   Recent Labs: 07/14/2018: B Natriuretic Peptide 1,618.1; Magnesium 2.3 07/15/2018: ALT 46; TSH 1.227 07/18/2018: BUN 20; Creatinine, Ser 1.02; Hemoglobin 12.4; Platelets 291; Potassium 3.8; Sodium 131  Personally reviewed   Wt Readings from Last 3 Encounters:  07/18/18 74.1 kg (163 lb 5.8 oz)  10/31/17 70.8 kg (156 lb)  10/21/16 70.2 kg (154 lb 12.8 oz)      ASSESSMENT AND PLAN:  1. Chronic systolic HF. ICM - Echo 07/14/18: EF 25-30% - RHC on 3/27 with normal filling pressures but low output with CI 1.58 in setting of AFL with RVR. LHC showed 3 grafts down with no PCI options.   - NYHA II-III, not very active. Volume sounds stable.  - Continue lasix 20 mg daily. Check BMET - Continue spiro 25 mg daily -  Continue Digoxin 0.125 daily. Check dig level.  - Continue Lisinopril 2.5 daily. Hold off on switching to [name] today with soft BP - Start coreg 3.125 mg BID. Discussed s/s to watch for. He will continue to monitor BP and HR at home.  - Discussed fluid and salt limitation and importance of daily weights - Repeat echo in 3-4 months. Discussed potential ICD if EF <35% at that time.   2. A fib/flutter with RVR with s/p TEE DCCV at [facility]  - s/p Aflutter ablation 07/17/18 - Denies palpitations. He was able to tell when he was out of rhythm previously.  - Continue amio 200 mg BID x 2 weeks, then 200 mg daily (starts 4/11).  - Continue Eliquis 5 mg BID. Denies bleeding.   3. CAD s/p CABG 2009 (LIMA-LAD, SVG to D1/OM1/OM2, SVG to D2, SVG to PDA) with recent NSTEMI  - Cath with 3 VG down and patent LIMA to LAD. No PCI options - Continue statin. Per Kyle [name], he needs to be on ASA 81 mg daily x 30 days.  - Referred to cardiac rehab at [name]. He is not interested in home cardiac rehab. Plans to start using stationary bike.  - No s/s ischemia. Has nitro at home. Has not needed to use.  - Start coreg as  above.   4. Depression - Has follow up with PCP at end of the month. Requested we send paperwork to office.   COVID screen The patient does not have any symptoms that suggest any further testing/ screening at this time.  Social distancing reinforced today.  Relevant cardiac medications were reviewed at length with the patient today.  The patient does not have concerns regarding their medications at this time.   Patient Risk: After full review of this patients clinical status, I feel that they are at moderate risk for cardiac decompensation at this time.  Check BMET, dig level at PCP's office. Start coreg 3.125 mg BID. Will have him take 81 mg ASA daily x 30 days. Will send over DC summary and this note to PCP office. Follow up in 2 weeks by webex visit. Will set up echo with Kyle [name] in 3-4 months.   Today, I have spent 23 minutes with the patient with telehealth technology discussing heart failure, medications, fluid and salt limitation, and COVID.    Signed, Alford Highland, NP  07/23/2018 1:54 PM  [facility] [address] [facility] [place] [address] [phone] (office) [phone] (fax)

## 2018-07-23 NOTE — Progress Notes (Addendum)
Spoke with patients wife, all instructions from visit summary reviewed. Questions answered. All information sent via Mychart.  Pt will be called to scheduled for visits and echo.  Will go to lab tomorrow or Monday. Advised to hold digoxin prior to test. Verbalized understanding. Lab script faxed to [facility] and NP visit summary sent to Dr. Sherril Croon

## 2018-07-23 NOTE — Addendum Note (Signed)
Encounter addended by: Marisa Hua, RN on: 07/23/2018 4:02 PM  Actions taken: Diagnosis association updated, Order list changed, Clinical Note Signed

## 2018-07-24 ENCOUNTER — Other Ambulatory Visit: Payer: Self-pay | Admitting: Cardiology

## 2018-07-25 LAB — BASIC METABOLIC PANEL
BUN/Creatinine Ratio: 17 (ref 10–24)
BUN: 19 mg/dL (ref 8–27)
CO2: 18 mmol/L — ABNORMAL LOW (ref 20–29)
Calcium: 9.3 mg/dL (ref 8.6–10.2)
Chloride: 95 mmol/L — ABNORMAL LOW (ref 96–106)
Creatinine, Ser: 1.09 mg/dL (ref 0.76–1.27)
GFR calc Af Amer: 84 mL/min/{1.73_m2} (ref >59)
GFR calc non Af Amer: 73 mL/min/{1.73_m2} (ref >59)
Glucose: 176 mg/dL — ABNORMAL HIGH (ref 65–99)
Potassium: 6 mmol/L — ABNORMAL HIGH (ref 3.5–5.2)
Sodium: 132 mmol/L — ABNORMAL LOW (ref 134–144)

## 2018-07-25 LAB — DIGOXIN LEVEL: Digoxin, Serum: 0.7 ng/mL (ref 0.5–0.9)

## 2018-07-27 ENCOUNTER — Telehealth (HOSPITAL_COMMUNITY): Payer: Self-pay | Admitting: Cardiology

## 2018-07-27 NOTE — Telephone Encounter (Signed)
Abnormal labs received from [facility] Labs drawn 07/24/18 K 6.0 Cr 1.09 BUN 19 Na 132  Per Morrie Sheldon Smith,NP Pt will need STAT bmet, stop spiro and avoid potassium rich foods in diet  Pt aware via wife, reports they will have labs repeated at [facility] -order faxed to [phone]

## 2018-07-28 ENCOUNTER — Other Ambulatory Visit (HOSPITAL_COMMUNITY): Payer: Self-pay | Admitting: Cardiology

## 2018-07-28 ENCOUNTER — Telehealth (HOSPITAL_COMMUNITY): Payer: Self-pay | Admitting: Cardiology

## 2018-07-28 NOTE — Telephone Encounter (Signed)
Labs received from [facility] Cr 1.17 BUN 23 K 4.8   Pt Kyle Smith,NP Labs improved stay off SPIRO   Pt aware via wife and voiced understanding

## 2018-08-06 ENCOUNTER — Ambulatory Visit (HOSPITAL_COMMUNITY)
Admission: RE | Admit: 2018-08-06 | Discharge: 2018-08-06 | Disposition: A | Discharge status: Home / Self Care | Payer: PRIVATE HEALTH INSURANCE | Source: Ambulatory Visit | Attending: Cardiology | Admitting: Cardiology

## 2018-08-06 ENCOUNTER — Other Ambulatory Visit: Payer: Self-pay

## 2018-08-06 DIAGNOSIS — I4891 Unspecified atrial fibrillation: Secondary | ICD-10-CM

## 2018-08-06 DIAGNOSIS — I2510 Atherosclerotic heart disease of native coronary artery without angina pectoris: Secondary | ICD-10-CM

## 2018-08-06 DIAGNOSIS — E875 Hyperkalemia: Secondary | ICD-10-CM

## 2018-08-06 DIAGNOSIS — I5022 Chronic systolic (congestive) heart failure: Secondary | ICD-10-CM | POA: Diagnosis not present

## 2018-08-06 DIAGNOSIS — F329 Major depressive disorder, single episode, unspecified: Secondary | ICD-10-CM

## 2018-08-06 DIAGNOSIS — I255 Ischemic cardiomyopathy: Secondary | ICD-10-CM | POA: Diagnosis not present

## 2018-08-06 DIAGNOSIS — Z7901 Long term (current) use of anticoagulants: Secondary | ICD-10-CM

## 2018-08-06 DIAGNOSIS — Z951 Presence of aortocoronary bypass graft: Secondary | ICD-10-CM

## 2018-08-06 DIAGNOSIS — Z8679 Personal history of other diseases of the circulatory system: Secondary | ICD-10-CM

## 2018-08-06 DIAGNOSIS — I252 Old myocardial infarction: Secondary | ICD-10-CM

## 2018-08-06 NOTE — Addendum Note (Signed)
Encounter addended by: Marisa Hua, RN on: 08/06/2018 12:43 PM  Actions taken: Clinical Note Signed

## 2018-08-06 NOTE — Progress Notes (Signed)
Heart Failure TeleHealth Note  Due to national recommendations of social distancing due to COVID 19, Audio/video telehealth visit is felt to be most appropriate for this patient at this time.  See MyChart message from today for patient consent regarding telehealth for [facility].  Date:  08/06/2018   ID:  [name], DOB 1955-12-09, MRN [id number]  Location: Home  Provider location: [place] [facility] Type of Visit: Established patient  PCP:  [name], MD  Cardiologist:  [name], MD Primary HF: Dr Gala Romney  Chief Complaint: HF follow up   History of Present Illness: 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  Recent admission 3/23-3/28/20 with exertional chest pain. Found to be in Afib RVR 150-160s. Started on diltiazem drip and underwent successful TEE/DCCV. Continued to have PAF, so started on amiodarone drip and ultimately underwent atrial flutter ablation. Transitioned to oral amiodarone and started on Eliquis. Noted to have elevated troponin with EKG changes on admission. LHC showed occluded SVGs and patent LIMA (occlusion of SVG to D1, D2 felt to be recent). No PCI options. Echo showed newly reduced EF 20-25%. RHC showed markedly low output with CI 1.58 (thought to be in setting of afib RVR). PICC line was placed and coox monitored closely. He did not require inotropes. HF meds were optimized. BB was stopped with low output. He was also treated with antibiotics for URI. Of note, pt's father died while patient was admitted. He was started on sertraline for depression. DC weight 163 lbs.   He had a virtual visit with me 2 weeks ago. He was doing well. He was started on coreg and told to take ASA 81 mg daily x 30 days. He had a BMET at PCP office, which showed K of 6.0. [name] was stopped he he had stat  repeat in [facility] ED, which showed K 4.8 (not yet scanned into epic, but see note from 4/7).   He presents via Web designer for a telehealth visit today. Overall doing well. Sometimes fatigued. Not sleeping well over the last 2-3 weeks. No problems with coreg. Appetite is good. He is walking in the house, did 30 labs in 35 foot house without SOB or CP. Legs are weak at times and he has to sit down. Feet no longer swelling. No orthopnea or PND. No dizziness. Urine is clear yellow. Had a few palpitations this morning, but not anything like before. Limits fluid and salt intake. Taking all medications. Asking if he can go back to work part time once COVID has calmed down. He wants to retire at the end of the year.   BP 102/58-123/68, generally 110s/60s HR 62-70 O2 99% Weight 145 lbs, down from 149 lbs last week.   he denies symptoms worrisome for COVID 19.   Past Medical History:  Diagnosis Date  . Coronary atherosclerosis of native coronary artery    Multivessel, LVEF 60%  . Essential hypertension, benign   . Hyperlipidemia   . Type 2 diabetes mellitus (HCC)    Past Surgical History:  Procedure Laterality Date  . A-FLUTTER ABLATION N/A 07/17/2018   Procedure: A-FLUTTER ABLATION;  Surgeon: [name], MD;  Location: [facility] INVASIVE CV LAB;  Service: Cardiovascular;  Laterality: N/A;  . CORONARY ARTERY BYPASS GRAFT  09/09   LIMA to LAD, SVG to D1/OM1/OM2, SVG to D2,  SVG to PDA  . RIGHT/LEFT HEART CATH AND CORONARY/GRAFT ANGIOGRAPHY N/A 07/17/2018   Procedure: RIGHT/LEFT HEART CATH AND CORONARY/GRAFT ANGIOGRAPHY;  Surgeon: SwazilandJordan, Peter M, MD;  Location: [facility] INVASIVE CV LAB;  Service: Cardiovascular;  Laterality: N/A;     Current Outpatient Medications  Medication Sig Dispense Refill  . amiodarone (PACERONE) 200 MG tablet Take 1 tablet (200 mg total) by mouth daily. 30 tablet 2  . apixaban (ELIQUIS) 5 MG TABS tablet Take 1 tablet (5 mg total) by mouth 2 (two) times daily. 60  tablet 3  . aspirin EC 81 MG tablet Take 1 tablet (81 mg total) by mouth daily for 30 days. 30 tablet 0  . atorvastatin (LIPITOR) 80 MG tablet Take 1 tablet (80 mg total) by mouth every evening. 30 tablet 2  . carvedilol (COREG) 3.125 MG tablet Take 1 tablet (3.125 mg total) by mouth 2 (two) times daily. 180 tablet 3  . digoxin (LANOXIN) 0.125 MG tablet Take 1 tablet (0.125 mg total) by mouth daily. 30 tablet 2  . furosemide (LASIX) 20 MG tablet Take 1 tablet (20 mg total) by mouth daily. 30 tablet 3  . lisinopril (PRINIVIL,ZESTRIL) 5 MG tablet Take 2.5 mg by mouth daily.     . pantoprazole (PROTONIX) 40 MG tablet Take 40 mg by mouth daily.      . sertraline (ZOLOFT) 50 MG tablet Take 1 tablet (50 mg total) by mouth daily for 30 days. 30 tablet 0  . fexofenadine (ALLEGRA) 180 MG tablet Take 180 mg by mouth as needed for allergies or rhinitis.    Marland Kitchen. glyBURIDE-metformin (GLUCOVANCE) 5-500 MG per tablet Take 1 tablet by mouth 2 (two) times daily.     . INVOKANA 100 MG TABS tablet Take 100 mg by mouth daily before breakfast.   12  . nitroGLYCERIN (NITROSTAT) 0.4 MG SL tablet Place 1 tablet (0.4 mg total) under the tongue every 5 (five) minutes x 3 doses as needed for chest pain. (Patient not taking: Reported on 07/23/2018) 30 tablet 6  . Omega-3 Fatty Acids (FISH OIL) 1000 MG CAPS Take 1,000 mg by mouth 2 (two) times daily.      No current facility-administered medications for this encounter.     Allergies:   Furosemide   Social History:  The patient  reports that he quit smoking about 35 years ago. His smoking use included cigarettes. He has never used smokeless tobacco. He reports that he does not drink alcohol or use drugs.   Family History:  The patient's family history includes Coronary artery disease in an other family member.   ROS:  Please see the history of present illness.   All other systems are personally reviewed and negative.   Exam:  (Video/Tele Health Call; Exam is subjective and  or/visual.) General:  Speaks in full sentences. No resp difficulty. Lungs: Normal respiratory effort with conversation.  Abdomen: Non-distended per patient report Extremities: Pt denies edema. Neuro: Alert & oriented x 3.   Recent Labs: 07/14/2018: B Natriuretic Peptide 1,618.1; Magnesium 2.3 07/15/2018: ALT 46; TSH 1.227 07/18/2018: Hemoglobin 12.4; Platelets 291 07/24/2018: BUN 19; Creatinine, Ser 1.09; Potassium 6.0; Sodium 132  Personally reviewed   Wt Readings from Last 3 Encounters:  07/18/18 74.1 kg (163 lb 5.8 oz)  10/31/17 70.8 kg (156 lb)  10/21/16 70.2 kg (154 lb 12.8 oz)      ASSESSMENT AND PLAN:  1. Chronic systolic HF. ICM - Echo 07/14/18: EF 25-30% - RHC on 3/27 with normal filling pressures  but low output with CI 1.58 in setting of AFL with RVR. LHC showed 3 grafts down with no PCI options.   - NYHA II. Volume sounds stable. - Continue lasix 20 mg daily. Discussed that weight shouldn't get much lower than where he is now. He will be on the lookout for dizziness or darker urine and will let us know so we can cut back lasix if needed. - Continue Digoxin 0.125 daily. Dig level 0.7 on 4/3 - Continue Lisinopril 2.5 daily. Hold off on switching to [facility] with soft BP and recent hyperkalemia. - Continue coreg 3.125 mg BID. - Off spiro with hyperkalemia to 6.0. Recheck was 4.8, lab not yet scanned in to Epic. Recheck again at PCP visit in a couple weeks. - Discussed fluid and salt limitation and importance of daily weights - Repeat echo in 3-4 months. Discussed potential ICD if EF <35% at that time. He is on recall list for echo.   2.A fib/flutter with RVRwith s/p TEE DCCV at [facility]  - s/p Aflutter ablation 07/17/18 - Denies palpitations He was able to tell when he was out of rhythm previously.  - Continue amio 200 mg daily. Needs CMET and TSH with next labs. Will see if we can check when he goes to PCP office. - Need to get annual eye exams.  - Continue Eliquis  5 mg BID. Denies bleeding  3. CAD s/p CABG 2009(LIMA-LAD, SVG to D1/OM1/OM2, SVG to D2, SVG to PDA)with recent NSTEMI  - Cath with 3 VG down and patent LIMA to LAD. No PCI options - Continue statin. Continue ASA 81 mg daily x 30 days per Dr Gala Romney - Referred to cardiac rehab at [facility]. He is not interested in home cardiac rehab. Plans to start using stationary bike.  - No s/s ischemia. Has nitro at home. Has not needed to use.  - Continue coreg 3.125 mg BID. HR too low to increase  4. Depression - Per PCP. Has an appointment 4/28. Sounds like he is doing relatively okay with recent passing of his father.  5. Hyperkalemia - K 6.0 on labs 4/3. [name] stopped and he had STAT repeat labs at [facility], which showed K 4.8 (see phone note from 4/7, labs not yet scanned in). Will repeat BMET at PCP appointment 4/28.   COVID screen The patient does not have any symptoms that suggest any further testing/ screening at this time.  Social distancing reinforced today.  The following changes were made today:  CMET and TSH at Dr Sherril Croon office 4/28. Dr Gala Romney is okay with him returning to work part time on light duty once COVID crisis resolved.  Recommended follow-up: 4 weeks follow up with APP  Today, I have spent 30 minutes with the patient with telehealth technology discussing the above issues .    Signed, Alford Highland, NP  08/06/2018 11:24 AM  [facility] [address] Heart and Vascular [place] [address] [phone] (office) [phone] (fax)

## 2018-08-06 NOTE — Progress Notes (Signed)
Spoke with patient, discussed AVS, verbalized understanding. Pt reports not able to do cardiac rehab because facility cancelled for now. Pt also moved to [place] and not able to do rehab in [place].  Pt reports he is doing a lot of walking at home with no concerns for SOB or chest pain.    AVS and Rx for labs mailed to [facility] address:  [address] [place], [place] [address]

## 2018-08-06 NOTE — Patient Instructions (Addendum)
A prescription have been mailed for you to have labs (CMET & TSH) to be done at your doctors office.. Please bring it with you to your lab appointment. Please have them fax results to [phone].  Per Dr. Gala Romney, you can return to work part time on light duty once COVID crisis has  Resolved.    Your physician recommends that you schedule a follow-up appointment in: 4 weeks with Nurse Pracitioners

## 2018-08-07 ENCOUNTER — Telehealth: Payer: Self-pay | Admitting: Internal Medicine

## 2018-08-07 NOTE — Telephone Encounter (Signed)
New message    Pt set up for phone call with Dr. [name] on 04.22.20. Pt does not have a smart phone.

## 2018-08-11 ENCOUNTER — Other Ambulatory Visit (HOSPITAL_COMMUNITY): Payer: Self-pay | Admitting: Internal Medicine

## 2018-08-12 ENCOUNTER — Other Ambulatory Visit: Payer: Self-pay

## 2018-08-12 ENCOUNTER — Telehealth (INDEPENDENT_AMBULATORY_CARE_PROVIDER_SITE_OTHER): Payer: PRIVATE HEALTH INSURANCE | Admitting: Internal Medicine

## 2018-08-12 DIAGNOSIS — Z951 Presence of aortocoronary bypass graft: Secondary | ICD-10-CM

## 2018-08-12 DIAGNOSIS — I5022 Chronic systolic (congestive) heart failure: Secondary | ICD-10-CM | POA: Diagnosis not present

## 2018-08-12 DIAGNOSIS — I10 Essential (primary) hypertension: Secondary | ICD-10-CM

## 2018-08-12 MED ORDER — AMIODARONE HCL 200 MG PO TABS: ORAL_TABLET | ORAL | 3 refills | Status: DC

## 2018-08-12 NOTE — Progress Notes (Signed)
Electrophysiology TeleHealth Note   Due to national recommendations of social distancing due to COVID 19, an audio/video telehealth visit is felt to be most appropriate for this patient at this time. Due to technical limitations, the patient could not do the video portion of the visit. See MyChart message from today for the patient's consent to telehealth for [facility].   Date:  08/12/2018   ID:  [name], DOB 1955-05-16, MRN [id number]  Location: patient's home  Provider location: [address], [place]  Evaluation Performed: Follow-up visit  PCP:  [name], MD  Cardiologist:  [name], MD  Electrophysiologist:  Dr [name]  Chief Complaint:  "I have a little tremor in my right hand"  History of Present Illness:    [name] is a 64 y.o. male who presents via audio/video conferencing for a telehealth visit today.  He has a h/o systolic heart failure and atrial fib who then developed atrial flutter on amiodarone. He has maintained NSR since undergoing ablation of atrial flutter. He has had a mild tremor. He also notes an increased sense of taste. No palpitations or chest pain or sob.   The patient denies symptoms of fevers, chills, cough, or new SOB worrisome for COVID 19.  Past Medical History:  Diagnosis Date  . Coronary atherosclerosis of native coronary artery    Multivessel, LVEF 60%  . Essential hypertension, benign   . Hyperlipidemia   . Type 2 diabetes mellitus (HCC)     Past Surgical History:  Procedure Laterality Date  . A-FLUTTER ABLATION N/A 07/17/2018   Procedure: A-FLUTTER ABLATION;  Surgeon: [name], MD;  Location: [facility] INVASIVE CV LAB;  Service: Cardiovascular;  Laterality: N/A;  . CORONARY ARTERY BYPASS GRAFT  09/09   LIMA to LAD, SVG to D1/OM1/OM2, SVG to D2, SVG to PDA  . RIGHT/LEFT HEART CATH AND CORONARY/GRAFT ANGIOGRAPHY N/A 07/17/2018   Procedure: RIGHT/LEFT HEART CATH AND CORONARY/GRAFT ANGIOGRAPHY;   Surgeon: Swaziland, Peter M, MD;  Location: [facility] INVASIVE CV LAB;  Service: Cardiovascular;  Laterality: N/A;    Current Outpatient Medications  Medication Sig Dispense Refill  . amiodarone (PACERONE) 200 MG tablet Take 1 tablet (200 mg total) by mouth daily. 30 tablet 2  . apixaban (ELIQUIS) 5 MG TABS tablet Take 1 tablet (5 mg total) by mouth 2 (two) times daily. 60 tablet 3  . aspirin EC 81 MG tablet Take 1 tablet (81 mg total) by mouth daily for 30 days. 30 tablet 0  . atorvastatin (LIPITOR) 80 MG tablet Take 1 tablet (80 mg total) by mouth every evening. 30 tablet 2  . carvedilol (COREG) 3.125 MG tablet Take 1 tablet (3.125 mg total) by mouth 2 (two) times daily. 180 tablet 3  . digoxin (LANOXIN) 0.125 MG tablet Take 1 tablet (0.125 mg total) by mouth daily. 30 tablet 2  . fexofenadine (ALLEGRA) 180 MG tablet Take 180 mg by mouth as needed for allergies or rhinitis.    . furosemide (LASIX) 20 MG tablet Take 1 tablet (20 mg total) by mouth daily. 30 tablet 3  . glyBURIDE-metformin (GLUCOVANCE) 5-500 MG per tablet Take 1 tablet by mouth 2 (two) times daily.     . INVOKANA 100 MG TABS tablet Take 100 mg by mouth daily before breakfast.   12  . lisinopril (PRINIVIL,ZESTRIL) 5 MG tablet Take 2.5 mg by mouth daily.     . nitroGLYCERIN (NITROSTAT) 0.4 MG SL tablet Place 1 tablet (0.4  mg total) under the tongue every 5 (five) minutes x 3 doses as needed for chest pain. (Patient not taking: Reported on 07/23/2018) 30 tablet 6  . Omega-3 Fatty Acids (FISH OIL) 1000 MG CAPS Take 1,000 mg by mouth 2 (two) times daily.     . pantoprazole (PROTONIX) 40 MG tablet Take 40 mg by mouth daily.      . sertraline (ZOLOFT) 50 MG tablet Take 1 tablet (50 mg total) by mouth daily for 30 days. 30 tablet 0   No current facility-administered medications for this visit.     Allergies:   Furosemide   Social History:  The patient  reports that he quit smoking about 35 years ago. His smoking use included cigarettes. He has  never used smokeless tobacco. He reports that he does not drink alcohol or use drugs.   Family History:  The patient's  family history includes Coronary artery disease in an other family member.   ROS:  Please see the history of present illness.   All other systems are personally reviewed and negative.    Exam:    Vital Signs:  BP - 115/65, P - 64, Wt. - 145, O2Sat - 98   Labs/Other Tests and Data Reviewed:    Recent Labs: 07/14/2018: B Natriuretic Peptide 1,618.1; Magnesium 2.3 07/15/2018: ALT 46; TSH 1.227 07/18/2018: Hemoglobin 12.4; Platelets 291 07/24/2018: BUN 19; Creatinine, Ser 1.09; Potassium 6.0; Sodium 132   Wt Readings from Last 3 Encounters:  07/18/18 163 lb 5.8 oz (74.1 kg)  10/31/17 156 lb (70.8 kg)  10/21/16 154 lb 12.8 oz (70.2 kg)     Other studies personally reviewed:   ASSESSMENT & PLAN:    1.  Atrial flutter - he is doing well s/p catheter ablation. 2. Atrial fib - he is maintaining NSR. We will reduce the amiodarone dose today. 3. Right hand tremor - unclear if this is due to amiodarone. We will reduce the dose of amiodarone and follow. 4. Chronic systolic heart failure - he will need a repeat heart cath and if EF remains depressed, consider ICD insertion. 5. COVID 19 screen The patient denies symptoms of COVID 19 at this time.  The importance of social distancing was discussed today.  Follow-up:  3 months with me  Current medicines are reviewed at length with the patient today.   The patient does not have concerns regarding his medicines.  The following changes were made today:  none  Labs/ tests ordered today include: none No orders of the defined types were placed in this encounter.    Patient Risk:  after full review of this patients clinical status, I feel that they are at moderate risk at this time.  Today, I have spent 25 minutes with the patient with telehealth technology discussing all of the above.    Signed, Lewayne BuntingGregg Jing Howatt, MD  08/12/2018  9:10 AM     [facility] [address] [place] [address] [phone] (office) [phone] (fax)

## 2018-08-12 NOTE — Addendum Note (Signed)
Addended by: Roney Mans A on: 08/12/2018 11:44 AM   Modules accepted: Orders

## 2018-08-31 ENCOUNTER — Other Ambulatory Visit (HOSPITAL_COMMUNITY): Payer: Self-pay | Admitting: Internal Medicine

## 2018-10-07 ENCOUNTER — Other Ambulatory Visit: Payer: Self-pay | Admitting: Internal Medicine

## 2018-10-28 ENCOUNTER — Other Ambulatory Visit: Payer: Self-pay | Admitting: Internal Medicine

## 2018-11-05 ENCOUNTER — Other Ambulatory Visit: Payer: Self-pay | Admitting: Internal Medicine

## 2018-11-05 NOTE — Telephone Encounter (Signed)
Eliquis 5mg  refill request received, pt is 63 yrs old, wt-70.8kg, Crea-1.09 on 07/24/2018, last Telemedicine by Dr. Lovena Le on 08/12/2018, will send in refill to requested pharmacy.

## 2018-11-19 ENCOUNTER — Telehealth: Payer: Self-pay

## 2018-11-19 NOTE — Telephone Encounter (Signed)

## 2018-11-20 ENCOUNTER — Ambulatory Visit (INDEPENDENT_AMBULATORY_CARE_PROVIDER_SITE_OTHER): Payer: PRIVATE HEALTH INSURANCE | Admitting: Internal Medicine

## 2018-11-20 ENCOUNTER — Other Ambulatory Visit: Payer: Self-pay

## 2018-11-20 ENCOUNTER — Encounter: Payer: Self-pay | Admitting: Internal Medicine

## 2018-11-20 ENCOUNTER — Ambulatory Visit (HOSPITAL_COMMUNITY): Payer: PRIVATE HEALTH INSURANCE | Attending: Internal Medicine

## 2018-11-20 DIAGNOSIS — I5022 Chronic systolic (congestive) heart failure: Secondary | ICD-10-CM | POA: Insufficient documentation

## 2018-11-20 DIAGNOSIS — I4891 Unspecified atrial fibrillation: Secondary | ICD-10-CM

## 2018-11-20 LAB — ECHOCARDIOGRAM COMPLETE
Height: 65 in
Weight: 2432 oz

## 2018-11-20 NOTE — Progress Notes (Signed)
HPI [name] returns today for followup. 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 He underwent flutter ablation 4 months ago. He has felt well in the interim and has gone back to work. He denies chest pain or sob. No edema. No palpitations. He is checking his HR daily and not had any fast rates. No syncope. Allergies  Allergen Reactions  . Furosemide Nausea Only     Current Outpatient Medications  Medication Sig Dispense Refill  . amiodarone (PACERONE) 200 MG tablet Take one tablet by mouth daily Monday through Saturday.  Do NOT take on Sunday. 90 tablet 3  . atorvastatin (LIPITOR) 80 MG tablet TAKE ONE TABLET BY MOUTH EVERY EVENING 30 tablet 2  . digoxin (LANOXIN) 0.125 MG tablet TAKE ONE TABLET BY MOUTH DAILY 90 tablet 1  . ELIQUIS 5 MG TABS tablet TAKE ONE TABLET BY MOUTH TWICE DAILY 60 tablet 5  . FARXIGA 5 MG TABS tablet Take 5 mg by mouth daily.    . fexofenadine (ALLEGRA) 180 MG tablet Take 180 mg by mouth as needed for allergies or rhinitis.    . furosemide (LASIX) 20 MG tablet Take 1 tablet (20 mg total) by mouth daily. 30 tablet 3  . glyBURIDE-metformin (GLUCOVANCE) 5-500 MG per tablet Take 1 tablet by mouth 2 (two) times daily.     Marland Kitchen lisinopril (PRINIVIL,ZESTRIL) 5 MG tablet Take 2.5 mg by mouth daily.     . nitroGLYCERIN (NITROSTAT) 0.4 MG SL tablet Place 1 tablet (0.4 mg total) under the tongue every 5 (five) minutes x 3 doses as needed for chest pain. 30 tablet 6  . Omega-3 Fatty Acids (FISH OIL) 1000 MG CAPS Take 1,000 mg by mouth 2 (two) times daily.     . pantoprazole (PROTONIX) 40 MG tablet Take 40 mg by mouth daily.      . carvedilol (COREG) 3.125 MG tablet Take 1 tablet (3.125 mg total) by mouth 2 (two) times daily. 180 tablet 3   No current facility-administered medications for this visit.      Past Medical History:  Diagnosis Date  . Coronary atherosclerosis of native coronary artery     Multivessel, LVEF 60%  . Essential hypertension, benign   . Hyperlipidemia   . Type 2 diabetes mellitus (HCC)     ROS:   All systems reviewed and negative except as noted in the HPI.   Past Surgical History:  Procedure Laterality Date  . A-FLUTTER ABLATION N/A 07/17/2018   Procedure: A-FLUTTER ABLATION;  Surgeon: Evans Lance, MD;  Location: [place] CV LAB;  Service: Cardiovascular;  Laterality: N/A;  . CORONARY ARTERY BYPASS GRAFT  09/09   LIMA to LAD, SVG to D1/OM1/OM2, SVG to D2, SVG to PDA  . RIGHT/LEFT HEART CATH AND CORONARY/GRAFT ANGIOGRAPHY N/A 07/17/2018   Procedure: RIGHT/LEFT HEART CATH AND CORONARY/GRAFT ANGIOGRAPHY;  Surgeon: Martinique, Peter M, MD;  Location: [place] CV LAB;  Service: Cardiovascular;  Laterality: N/A;     Family History  Problem Relation Age of Onset  . Coronary artery disease Other      Social History   Socioeconomic History  . Marital status: Married    Spouse name: [name]  . Number of children: Not on file  . Years of education: Not on file  . Highest education level: Not on file  Occupational History    Employer: UNEMPLOYED  Social Needs  .  Financial resource strain: Not on file  . Food insecurity    Worry: Not on file    Inability: Not on file  . Transportation needs    Medical: Not on file    Non-medical: Not on file  Tobacco Use  . Smoking status: Former Smoker    Types: Cigarettes    Quit date: 04/23/1983    Years since quitting: 35.6  . Smokeless tobacco: Never Used  . Tobacco comment: Year Quit: 1985  Substance and Sexual Activity  . Alcohol use: No    Alcohol/week: 0.0 standard drinks  . Drug use: No  . Sexual activity: Not on file  Lifestyle  . Physical activity    Days per week: Not on file    Minutes per session: Not on file  . Stress: Not on file  Relationships  . Social Musicianconnections    Talks on phone: Not on file    Gets together: Not on file    Attends religious service: Not on file    Active  member of club or organization: Not on file    Attends meetings of clubs or organizations: Not on file    Relationship status: Not on file  . Intimate partner violence    Fear of current or ex partner: Not on file    Emotionally abused: Not on file    Physically abused: Not on file    Forced sexual activity: Not on file  Other Topics Concern  . Not on file  Social History Narrative  . Not on file     BP 118/64   Pulse (!) 57   Ht 5\' 5"  (1.651 m)   Wt 152 lb (68.9 kg)   SpO2 99%   BMI 25.29 kg/m   Physical Exam:  Well appearing NAD HEENT: Unremarkable Neck:  No JVD, no thyromegally Lymphatics:  No adenopathy Back:  No CVA tenderness Lungs:  Clear HEART:  Regular rate rhythm, no murmurs, no rubs, no clicks Abd:  soft, positive bowel sounds, no organomegally, no rebound, no guarding Ext:  2 plus pulses, no edema, no cyanosis, no clubbing Skin:  No rashes no nodules Neuro:  CN II through XII intact, motor grossly intact  EKG - nsr  Assess/Plan: 1. Atrial fib - he is maintaining NSR and will continue low dose amio 2. Atrial flutter - he is s/p ablation. He will continue his current meds.  3. 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 He will repeat his echo today and if his EF has normalized, we will stop his CHF meds. 4. CAD - he is s/p CABG and is asymptomatic. He will continue his statin.   Leonia ReevesGregg Maximiano Lott,M.D.

## 2018-11-20 NOTE — Patient Instructions (Addendum)
Medication Instructions:  Your physician recommends that you continue on your current medications as directed. Please refer to the Current Medication list given to you today.  Labwork: None ordered.  Testing/Procedures: None ordered.  Follow-Up: Your physician wants you to follow-up in: based on results of your ECHO.  Any Other Special Instructions Will Be Listed Below (If Applicable).  If you need a refill on your cardiac medications before your next appointment, please call your pharmacy.

## 2018-11-27 ENCOUNTER — Telehealth: Payer: Self-pay

## 2018-11-27 NOTE — Telephone Encounter (Signed)
Call placed to Pt and wife.  Long discussion covering history of Pt reduced EF starting in March.  Discussed results of ECHO.  Advised Dr. Lovena Le would like to see Pt to discuss ICD placement.  Appt made.  Wife to accompany husband for discussion.

## 2018-12-11 ENCOUNTER — Encounter: Payer: Self-pay | Admitting: Internal Medicine

## 2018-12-11 ENCOUNTER — Ambulatory Visit (INDEPENDENT_AMBULATORY_CARE_PROVIDER_SITE_OTHER): Payer: PRIVATE HEALTH INSURANCE | Admitting: Internal Medicine

## 2018-12-11 ENCOUNTER — Other Ambulatory Visit: Payer: Self-pay

## 2018-12-11 VITALS — BP 116/62 | HR 54 | Ht 65.0 in | Wt 152.0 lb

## 2018-12-11 DIAGNOSIS — I5022 Chronic systolic (congestive) heart failure: Secondary | ICD-10-CM | POA: Diagnosis not present

## 2018-12-11 DIAGNOSIS — I10 Essential (primary) hypertension: Secondary | ICD-10-CM

## 2018-12-11 NOTE — Progress Notes (Signed)
HPI [name] returns today for followup and discuss ICD insertion. He is a pleasant 63 yo man with atrial flutter who was thought to have a tachy induced CM who underwent catheter ablation several months ago. Despite maximal medical therapy, he has undergone repeat 2D echo and has not had improvement in his LV function. He has class 1 symptoms. His wife is with him today and notes that he can do anything he wants with out limitation. He denies angina. He is s/p CABG.  Allergies  Allergen Reactions  . Furosemide Nausea Only     Current Outpatient Medications  Medication Sig Dispense Refill  . amiodarone (PACERONE) 200 MG tablet Take one tablet by mouth daily Monday through Saturday.  Do NOT take on Sunday. 90 tablet 3  . atorvastatin (LIPITOR) 80 MG tablet TAKE ONE TABLET BY MOUTH EVERY EVENING 30 tablet 2  . digoxin (LANOXIN) 0.125 MG tablet TAKE ONE TABLET BY MOUTH DAILY 90 tablet 1  . ELIQUIS 5 MG TABS tablet TAKE ONE TABLET BY MOUTH TWICE DAILY 60 tablet 5  . FARXIGA 5 MG TABS tablet Take 5 mg by mouth daily.    . fexofenadine (ALLEGRA) 180 MG tablet Take 180 mg by mouth as needed for allergies or rhinitis.    . furosemide (LASIX) 20 MG tablet Take 1 tablet (20 mg total) by mouth daily. 30 tablet 3  . glyBURIDE-metformin (GLUCOVANCE) 5-500 MG per tablet Take 1 tablet by mouth 2 (two) times daily.     Marland Kitchen lisinopril (PRINIVIL,ZESTRIL) 5 MG tablet Take 2.5 mg by mouth daily.     . nitroGLYCERIN (NITROSTAT) 0.4 MG SL tablet Place 1 tablet (0.4 mg total) under the tongue every 5 (five) minutes x 3 doses as needed for chest pain. 30 tablet 6  . Omega-3 Fatty Acids (FISH OIL) 1000 MG CAPS Take 1,000 mg by mouth 2 (two) times daily.     . pantoprazole (PROTONIX) 40 MG tablet Take 40 mg by mouth daily.      . carvedilol (COREG) 3.125 MG tablet Take 1 tablet (3.125 mg total) by mouth 2 (two) times daily. 180 tablet 3   No current facility-administered medications for this visit.       Past Medical History:  Diagnosis Date  . Coronary atherosclerosis of native coronary artery    Multivessel, LVEF 60%  . Essential hypertension, benign   . Hyperlipidemia   . Type 2 diabetes mellitus (HCC)     ROS:   All systems reviewed and negative except as noted in the HPI.   Past Surgical History:  Procedure Laterality Date  . A-FLUTTER ABLATION N/A 07/17/2018   Procedure: A-FLUTTER ABLATION;  Surgeon: Evans Lance, MD;  Location: [facility];  Service: Cardiovascular;  Laterality: N/A;  . CORONARY ARTERY BYPASS GRAFT  09/09   LIMA to LAD, SVG to D1/OM1/OM2, SVG to D2, SVG to PDA  . RIGHT/LEFT HEART CATH AND CORONARY/GRAFT ANGIOGRAPHY N/A 07/17/2018   Procedure: RIGHT/LEFT HEART CATH AND CORONARY/GRAFT ANGIOGRAPHY;  Surgeon: Martinique, Peter M, MD;  Location: [facility];  Service: Cardiovascular;  Laterality: N/A;     Family History  Problem Relation Age of Onset  . Coronary artery disease Other      Social History   Socioeconomic History  . Marital status: Married    Spouse name: [name]  . Number of children: Not on file  . Years of education: Not on file  . Highest education level: Not on  file  Occupational History    Employer: UNEMPLOYED  Social Needs  . Financial resource strain: Not on file  . Food insecurity    Worry: Not on file    Inability: Not on file  . Transportation needs    Medical: Not on file    Non-medical: Not on file  Tobacco Use  . Smoking status: Former Smoker    Types: Cigarettes    Quit date: 04/23/1983    Years since quitting: 35.6  . Smokeless tobacco: Never Used  . Tobacco comment: Year Quit: 1985  Substance and Sexual Activity  . Alcohol use: No    Alcohol/week: 0.0 standard drinks  . Drug use: No  . Sexual activity: Not on file  Lifestyle  . Physical activity    Days per week: Not on file    Minutes per session: Not on file  . Stress: Not on file  Relationships  . Social Musicianconnections    Talks on phone: Not  on file    Gets together: Not on file    Attends religious service: Not on file    Active member of club or organization: Not on file    Attends meetings of clubs or organizations: Not on file    Relationship status: Not on file  . Intimate partner violence    Fear of current or ex partner: Not on file    Emotionally abused: Not on file    Physically abused: Not on file    Forced sexual activity: Not on file  Other Topics Concern  . Not on file  Social History Narrative  . Not on file     BP 116/62   Pulse (!) 54   Ht 5\' 5"  (1.651 m)   Wt 152 lb (68.9 kg)   SpO2 99%   BMI 25.29 kg/m   Physical Exam:  Well appearing NAD HEENT: Unremarkable Neck:  No JVD, no thyromegally Lymphatics:  No adenopathy Back:  No CVA tenderness Lungs:  Clear HEART:  Regular rate rhythm, no murmurs, no rubs, no clicks Abd:  soft, positive bowel sounds, no organomegally, no rebound, no guarding Ext:  2 plus pulses, no edema, no cyanosis, no clubbing Skin:  No rashes no nodules Neuro:  CN II through XII intact, motor grossly intact  EKG - nsr   Assess/Plan: 1. Atrial flutter - he is maintaining NSR s/p ablation. 2. CAD - he denies anginal symptoms.  3. Chronic systolic heart failure - despite his EF of 25%, he has class 1 symptoms. He will undergo watchful waiting as I discussed the indications for ICD insertion and he will call us if he decides to undergo ICD insertion.  4. Atrial fib - he will continue low dose amiodarone.  Leonia ReevesGregg Taylor,M.D.

## 2018-12-11 NOTE — Patient Instructions (Signed)
Medication Instructions:  Your physician recommends that you continue on your current medications as directed. Please refer to the Current Medication list given to you today.  Labwork: None ordered.  Testing/Procedures: None ordered.  Follow-Up: Your physician recommends that you schedule a follow-up appointment if you decide to have an ICD implanted.  Any Other Special Instructions Will Be Listed Below (If Applicable).     If you need a refill on your cardiac medications before your next appointment, please call your pharmacy.

## 2018-12-13 ENCOUNTER — Encounter: Payer: Self-pay | Admitting: Cardiology

## 2018-12-13 NOTE — Progress Notes (Signed)
Cardiology Office Note  Date: 12/14/2018   ID: Kipper, Buch 09-06-1955, MRN [id number]  PCP:  Glenda Chroman, MD  Cardiologist:  Rozann Lesches, MD Electrophysiologist:  Cristopher Peru, MD   Chief Complaint  Patient presents with  . Cardiac follow-up    History of Present Illness: [name] is a 63 y.o. male that I last saw in July 2019. I reviewed extensive interval records and updated the chart.  He was hospitalized in March with exertional chest pain. Found to be in Afib RVR 150-160s. Started on diltiazem drip and underwent successful TEE/DCCV. Continued to have PAF, so started on amiodarone drip and ultimately underwent atrial flutter ablation. Transitioned to oral amiodarone and started on Eliquis. Noted to have elevated troponin with EKG changes on admission. LHC showedoccluded SVGs and patent LIMA (occlusion of SVG to D1, D2 felt to be recent).No PCI options. Echo showed newly reduced EF 20-25%. RHC showed markedly low output with CI 1.58 (thought to be in setting of afib RVR). PICC line was placed and coox monitored closely. He did not require inotropes. HF meds were optimized. BB was stopped with low output. He has since seen Dr. Lovena Le to discuss ICD and plan for now is observation.  I note that he did have hyperkalemia on aldactone which had to be stopped. He was not switched to [facility] as of heart failure team visit in April due to blood pressure at the time.  He presents today for a follow-up visit.  States that he feels well overall.  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  He reports NYHA class II dyspnea, no exertional chest pain, palpitations, or syncope.  He is hesitant to consider ICD placement at the time.  I did talk with him about trying to switch from lisinopril to [facility] although we would need to keep an eye on his blood pressure and also renal function with electrolytes.  Past Medical History:  Diagnosis  Date  . Atrial flutter [facility])    RFA March 2020 - Dr. Lovena Le  . Cardiomyopathy ([place])   . Coronary atherosclerosis of native coronary artery    Multivessel, LVEF 60%  . Essential hypertension   . Hyperlipidemia   . Type 2 diabetes mellitus ([place])     Past Surgical History:  Procedure Laterality Date  . A-FLUTTER ABLATION N/A 07/17/2018   Procedure: A-FLUTTER ABLATION;  Surgeon: Evans Lance, MD;  Location: [place] [facility];  Service: Cardiovascular;  Laterality: N/A;  . CORONARY ARTERY BYPASS GRAFT  09/09   LIMA to LAD, SVG to D1/OM1/OM2, SVG to D2, SVG to PDA  . RIGHT/LEFT HEART CATH AND CORONARY/GRAFT ANGIOGRAPHY N/A 07/17/2018   Procedure: RIGHT/LEFT HEART CATH AND CORONARY/GRAFT ANGIOGRAPHY;  Surgeon: Martinique, Peter M, MD;  Location: [place] [facility];  Service: Cardiovascular;  Laterality: N/A;    Current Outpatient Medications  Medication Sig Dispense Refill  . amiodarone (PACERONE) 200 MG tablet Take one tablet by mouth daily Monday through Saturday.  Do NOT take on Sunday. 90 tablet 3  . atorvastatin (LIPITOR) 80 MG tablet TAKE ONE TABLET BY MOUTH EVERY EVENING 30 tablet 2  . carvedilol (COREG) 3.125 MG tablet Take 1 tablet (3.125 mg total) by mouth 2 (two) times daily. 180 tablet 3  . digoxin (LANOXIN) 0.125 MG tablet TAKE ONE TABLET BY MOUTH DAILY 90 tablet 1  . ELIQUIS 5 MG TABS tablet TAKE ONE TABLET BY MOUTH TWICE DAILY  60 tablet 5  . FARXIGA 5 MG TABS tablet Take 5 mg by mouth daily.    . fexofenadine (ALLEGRA) 180 MG tablet Take 180 mg by mouth as needed for allergies or rhinitis.    . furosemide (LASIX) 20 MG tablet Take 1 tablet (20 mg total) by mouth daily. 30 tablet 3  . glyBURIDE-metformin (GLUCOVANCE) 5-500 MG per tablet Take 1 tablet by mouth 2 (two) times daily.     . nitroGLYCERIN (NITROSTAT) 0.4 MG SL tablet Place 1 tablet (0.4 mg total) under the tongue every 5 (five) minutes x 3 doses as needed for chest pain. 30 tablet 6  . Omega-3 Fatty Acids (FISH OIL)  1000 MG CAPS Take 1,000 mg by mouth 2 (two) times daily.     . pantoprazole (PROTONIX) 40 MG tablet Take 40 mg by mouth daily.      . sacubitril-valsartan (ENTRESTO) 24-26 MG Take 1 tablet by mouth 2 (two) times daily. 60 tablet 6   No current facility-administered medications for this visit.    Allergies:  Furosemide   Social History: The patient  reports that he quit smoking about 35 years ago. His smoking use included cigarettes. He has never used smokeless tobacco. He reports that he does not drink alcohol or use drugs.   ROS:  Please see the history of present illness. Otherwise, complete review of systems is positive for none.  All other systems are reviewed and negative.   Physical Exam: VS:  BP 112/64   Pulse (!) 56   Ht _0  (1.651 m)   Wt 152 lb (68.9 kg)   SpO2 97%   BMI 25.29 kg/m , BMI Body mass index is 25.29 kg/m.  Wt Readings from Last 3 Encounters:  12/14/18 152 lb (68.9 kg)  12/11/18 152 lb (68.9 kg)  11/20/18 152 lb (68.9 kg)    General: Patient appears comfortable at rest. HEENT: Conjunctiva and lids normal, wearing a mask. Neck: Supple, no elevated JVP or carotid bruits, no thyromegaly. Lungs: Clear to auscultation, nonlabored breathing at rest. Cardiac: Regular rate and rhythm, no S3 or significant systolic murmur. Abdomen: Soft, nontender, bowel sounds present. Extremities: No pitting edema, distal pulses 2+. Skin: Warm and dry. Musculoskeletal: No kyphosis. Neuropsychiatric: Alert and oriented x3, affect grossly appropriate.  ECG:  An ECG dated 11/20/2018 was personally reviewed today and demonstrated:  Sinus bradycardia with possible old inferior infarct pattern.  Recent Labwork: 07/14/2018: B Natriuretic Peptide 1,618.1; Magnesium 2.3 07/15/2018: ALT 46; AST 86; TSH 1.227 07/18/2018: Hemoglobin 12.4; Platelets 291 07/24/2018: BUN 19; Creatinine, Ser 1.09; Potassium 6.0; Sodium 132     Component Value Date/Time   CHOL 173 07/15/2018 0355   TRIG 85  07/15/2018 0355   HDL 51 07/15/2018 0355   CHOLHDL 3.4 07/15/2018 0355   VLDL 17 07/15/2018 0355   LDLCALC 105 (H) 07/15/2018 0355  08/19/2018: BUN 22, creatinine 0.97, potassium 5.0, TSH 2.5, AST 22. ALT 56  Other Studies Reviewed Today:  Echocardiogram 11/20/2018:  1. The left ventricle has severely reduced systolic function, with an ejection fraction of 20-25%. The cavity size was mildly dilated. Left ventricular diastolic Doppler parameters are consistent with pseudonormalization. Elevated left ventricular  end-diastolic pressure. E/e' 18.  2. The right ventricle has mildly reduced systolic function. The cavity was normal. There is no increase in right ventricular wall thickness. Right ventricular systolic pressure could not be assessed.  3. The aortic valve is tricuspid. Mild calcification of the aortic valve. Aortic valve regurgitation is trivial  by color flow Doppler. No stenosis of the aortic valve.  4. There is mild dilatation of the ascending aorta measuring 40 mm.  5. When compared to the prior study: 07/14/2018 - no significant change has occured, despite variations in reporting. Side by side comparison of images performed.  Assessment and Plan:  1.  Ischemic cardiomyopathy with LVEF 20 to 25% by most recent evaluation.  He is currently on Coreg, Lipitor, Lanoxin, Lasix, and lisinopril.  Did not tolerate Aldactone due to hyperkalemia.  He is not on aspirin given concurrent use of Eliquis.  Today we discussed switching from lisinopril to Entresto at low dose after appropriate washout with follow-up BMET and clinical visit thereafter.  He met with Dr. Lovena Le and at this point is deferring ICD.  2.  Chronic systolic heart failure, clinically quite stable at this time with no evidence of fluid overload.  No changes made to current diuretic regimen.  3.  Atrial flutter with RVR status post ablation in March with follow-up by Dr. Lovena Le.  He also has a history of atrial fibrillation and  remains on amiodarone along with Eliquis.  He does not report any bleeding problems.  4.  Multivessel CAD status post CABG with documented graft disease and plan for medical therapy.  He does not report any active angina.  5.  History of hyperkalemia on Aldactone which was discontinued.  Last follow-up potassium was 5.0.  Medication Adjustments/Labs and Tests Ordered: Current medicines are reviewed at length with the patient today.  Concerns regarding medicines are outlined above.   Tests Ordered: Orders Placed This Encounter  Procedures  . Basic metabolic panel    Medication Changes: Meds ordered this encounter  Medications  . DISCONTD: sacubitril-valsartan (ENTRESTO) 24-26 MG    Sig: Take 1 tablet by mouth 2 (two) times daily.    Dispense:  60 tablet    Refill:  0    TO BE USED WITH FREE 30 DAY TRIAL CARD  . sacubitril-valsartan (ENTRESTO) 24-26 MG    Sig: Take 1 tablet by mouth 2 (two) times daily.    Dispense:  60 tablet    Refill:  6    New 12/14/18 - stopping Lisinopril.    Disposition:  Follow up 4 weeks in the [place] office.  Signed, Satira Sark, MD, [facility] 12/14/2018 4:42 PM    [facility] [place], [place], [place] [address] Phone: [phone]; Fax: [phone]

## 2018-12-14 ENCOUNTER — Other Ambulatory Visit: Payer: Self-pay

## 2018-12-14 ENCOUNTER — Ambulatory Visit: Payer: PRIVATE HEALTH INSURANCE | Admitting: Cardiology

## 2018-12-14 ENCOUNTER — Encounter: Payer: Self-pay | Admitting: Cardiology

## 2018-12-14 VITALS — BP 112/64 | HR 56 | Ht 65.0 in | Wt 152.0 lb

## 2018-12-14 DIAGNOSIS — Z8679 Personal history of other diseases of the circulatory system: Secondary | ICD-10-CM | POA: Diagnosis not present

## 2018-12-14 DIAGNOSIS — I5022 Chronic systolic (congestive) heart failure: Secondary | ICD-10-CM | POA: Diagnosis not present

## 2018-12-14 DIAGNOSIS — I429 Cardiomyopathy, unspecified: Secondary | ICD-10-CM | POA: Diagnosis not present

## 2018-12-14 DIAGNOSIS — I25119 Atherosclerotic heart disease of native coronary artery with unspecified angina pectoris: Secondary | ICD-10-CM

## 2018-12-14 MED ORDER — SACUBITRIL-VALSARTAN 24-26 MG PO TABS: 1.0000 | ORAL_TABLET | Freq: Two times a day (BID) | ORAL | 0 refills | Status: DC

## 2018-12-14 MED ORDER — SACUBITRIL-VALSARTAN 24-26 MG PO TABS: 1.0000 | ORAL_TABLET | Freq: Two times a day (BID) | ORAL | 6 refills | Status: DC

## 2018-12-14 NOTE — Patient Instructions (Signed)
Medication Instructions:   Stop Lisinopril.   After 36 hours, begin Entresto 24/26mg  twice a day.  Continue all other medications.    Labwork:  BMET - order given today.   Please do in approximately 10 days.   Office will contact with results via phone or letter.    Testing/Procedures: none  Follow-Up: 4 weeks   Any Other Special Instructions Will Be Listed Below (If Applicable).  If you need a refill on your cardiac medications before your next appointment, please call your pharmacy.

## 2018-12-30 ENCOUNTER — Other Ambulatory Visit: Payer: Self-pay | Admitting: Cardiology

## 2019-01-05 ENCOUNTER — Encounter: Payer: Self-pay | Admitting: *Deleted

## 2019-01-07 ENCOUNTER — Telehealth: Payer: Self-pay | Admitting: *Deleted

## 2019-01-07 NOTE — Telephone Encounter (Signed)
Results sent to my chart.  

## 2019-01-07 NOTE — Telephone Encounter (Signed)
-----   Message from Satira Sark, MD sent at 01/05/2019  4:23 PM EDT ----- Results reviewed.  Renal function and potassium are stable.  Continue with current plan.

## 2019-01-19 ENCOUNTER — Other Ambulatory Visit: Payer: Self-pay

## 2019-01-19 ENCOUNTER — Ambulatory Visit: Payer: PRIVATE HEALTH INSURANCE | Admitting: Cardiology

## 2019-01-19 ENCOUNTER — Encounter: Payer: Self-pay | Admitting: Cardiology

## 2019-01-19 VITALS — BP 110/63 | HR 62 | Temp 97.0°F | Ht 66.0 in | Wt 157.0 lb

## 2019-01-19 DIAGNOSIS — I429 Cardiomyopathy, unspecified: Secondary | ICD-10-CM

## 2019-01-19 DIAGNOSIS — I5022 Chronic systolic (congestive) heart failure: Secondary | ICD-10-CM

## 2019-01-19 DIAGNOSIS — I480 Paroxysmal atrial fibrillation: Secondary | ICD-10-CM

## 2019-01-19 DIAGNOSIS — I255 Ischemic cardiomyopathy: Secondary | ICD-10-CM | POA: Diagnosis not present

## 2019-01-19 NOTE — Progress Notes (Signed)
Cardiology Office Note  Date: 01/19/2019   ID: [name], DOB 1955/10/17, MRN [id number]  PCP:  [name], MD  Cardiologist:  [name], MD Electrophysiologist:  [name], MD   Chief Complaint  Patient presents with  . Cardiac follow-up    History of Present Illness: [name] is a 63 y.o. male last seen in August.  He is here for a follow-up visit.  Still reports doing very well, NYHA class I-II dyspnea, no angina symptoms, palpitations, or syncope.  He still works for the city of [employer].  At the last visit we switched from lisinopril to [facility].  Follow-up lab work showed normal creatinine and potassium.  He has tolerated this switch well.  We reviewed home blood pressure and heart rate checks.  He has had no hypotension.  He still does not want to pursue a defibrillator.  Past Medical History:  Diagnosis Date  . Atrial flutter [facility])    RFA March 2020 - Dr. [name]  . Cardiomyopathy (HCC)   . Coronary atherosclerosis of native coronary artery    Multivessel, LVEF 60%  . Essential hypertension   . Hyperlipidemia   . Type 2 diabetes mellitus (HCC)     Past Surgical History:  Procedure Laterality Date  . A-FLUTTER ABLATION N/A 07/17/2018   Procedure: A-FLUTTER ABLATION;  Surgeon: [name], MD;  Location: [facility] INVASIVE CV LAB;  Service: Cardiovascular;  Laterality: N/A;  . CORONARY ARTERY BYPASS GRAFT  09/09   LIMA to LAD, SVG to D1/OM1/OM2, SVG to D2, SVG to PDA  . RIGHT/LEFT HEART CATH AND CORONARY/GRAFT ANGIOGRAPHY N/A 07/17/2018   Procedure: RIGHT/LEFT HEART CATH AND CORONARY/GRAFT ANGIOGRAPHY;  Surgeon: Swaziland, Peter M, MD;  Location: [facility] INVASIVE CV LAB;  Service: Cardiovascular;  Laterality: N/A;    Current Outpatient Medications  Medication Sig Dispense Refill  . amiodarone (PACERONE) 200 MG tablet Take one tablet by mouth daily Monday through Saturday.  Do NOT take on Sunday. 90 tablet 3  . atorvastatin (LIPITOR) 80 MG tablet TAKE ONE  TABLET BY MOUTH EVERY EVENING 30 tablet 2  . carvedilol (COREG) 3.125 MG tablet Take 1 tablet (3.125 mg total) by mouth 2 (two) times daily. 180 tablet 3  . digoxin (LANOXIN) 0.125 MG tablet TAKE ONE TABLET BY MOUTH DAILY 90 tablet 1  . ELIQUIS 5 MG TABS tablet TAKE ONE TABLET BY MOUTH TWICE DAILY 60 tablet 5  . FARXIGA 5 MG TABS tablet Take 5 mg by mouth daily.    . fexofenadine (ALLEGRA) 180 MG tablet Take 180 mg by mouth as needed for allergies or rhinitis.    . furosemide (LASIX) 20 MG tablet Take 1 tablet (20 mg total) by mouth daily. 30 tablet 3  . glyBURIDE-metformin (GLUCOVANCE) 5-500 MG per tablet Take 1 tablet by mouth 2 (two) times daily.     . nitroGLYCERIN (NITROSTAT) 0.4 MG SL tablet Place 1 tablet (0.4 mg total) under the tongue every 5 (five) minutes x 3 doses as needed for chest pain. 30 tablet 6  . Omega-3 Fatty Acids (FISH OIL) 1000 MG CAPS Take 1,000 mg by mouth 2 (two) times daily.     . pantoprazole (PROTONIX) 40 MG tablet Take 40 mg by mouth daily.      . sacubitril-valsartan (ENTRESTO) 24-26 MG Take 1 tablet by mouth 2 (two) times daily. 60 tablet 6   No current facility-administered medications for this visit.    Allergies:  Furosemide   Social History: The  patient  reports that he quit smoking about 35 years ago. His smoking use included cigarettes. He has never used smokeless tobacco. He reports that he does not drink alcohol or use drugs.   ROS:  Please see the history of present illness. Otherwise, complete review of systems is positive for none.  All other systems are reviewed and negative.   Physical Exam: VS:  BP 110/63 (BP Location: Right Arm)   Pulse 62   Temp (!) 97 F (36.1 C)   Ht 5\' 6"  (1.676 m)   Wt 157 lb (71.2 kg)   SpO2 97%   BMI 25.34 kg/m , BMI Body mass index is 25.34 kg/m.  Wt Readings from Last 3 Encounters:  01/19/19 157 lb (71.2 kg)  12/14/18 152 lb (68.9 kg)  12/11/18 152 lb (68.9 kg)    General: Patient appears comfortable at  rest. HEENT: Conjunctiva and lids normal, wearing a mask. Neck: Supple, no elevated JVP or carotid bruits, no thyromegaly. Lungs: Clear to auscultation, nonlabored breathing at rest. Cardiac: Regular rate and rhythm, no S3 or significant systolic murmur, no pericardial rub. Abdomen: Soft, nontender, bowel sounds present. Extremities: No pitting edema, distal pulses 2+. Skin: Warm and dry. Musculoskeletal: No kyphosis. Neuropsychiatric: Alert and oriented x3, affect grossly appropriate.  ECG:  An ECG dated 12/11/2018 was personally reviewed today and demonstrated:  Sinus bradycardia with leftward axis and possible old inferior infarct pattern.  Recent Labwork: 07/14/2018: B Natriuretic Peptide 1,618.1; Magnesium 2.3 07/15/2018: ALT 46; AST 86; TSH 1.227 07/18/2018: Hemoglobin 12.4; Platelets 291 07/24/2018: BUN 19; Creatinine, Ser 1.09; Potassium 6.0; Sodium 132     Component Value Date/Time   CHOL 173 07/15/2018 0355   TRIG 85 07/15/2018 0355   HDL 51 07/15/2018 0355   CHOLHDL 3.4 07/15/2018 0355   VLDL 17 07/15/2018 0355   LDLCALC 105 (H) 07/15/2018 0355  September 2020: BUN 22, creatinine 0.91, potassium 4.4  Other Studies Reviewed Today:  Echocardiogram 11/20/2018: 1. The left ventricle has severely reduced systolic function, with an ejection fraction of 20-25%. The cavity size was mildly dilated. Left ventricular diastolic Doppler parameters are consistent with pseudonormalization. Elevated left ventricular  end-diastolic pressure. E/e' 18. 2. The right ventricle has mildly reduced systolic function. The cavity was normal. There is no increase in right ventricular wall thickness. Right ventricular systolic pressure could not be assessed. 3. The aortic valve is tricuspid. Mild calcification of the aortic valve. Aortic valve regurgitation is trivial by color flow Doppler. No stenosis of the aortic valve. 4. There is mild dilatation of the ascending aorta measuring 40 mm. 5. When  compared to the prior study: 07/14/2018 - no significant change has occured, despite variations in reporting. Side by side comparison of images performed.  Assessment and Plan:  1.  Ischemic cardiomyopathy, LVEF 20 to 25% range based on recent evaluation.  He [facility] a [facility] and we will otherwise continue Coreg, Lanoxin, and Lasix.  He did not tolerate Aldactone due to hyperkalemia previously.  He declines defibrillator placement.  Plan will be follow-up echocardiogram prior to his next visit.  2.  Chronic systolic heart failure, doing well in terms of symptom control, NYHA class I-II dyspnea and no evidence of fluid overload.  3.  Atrial flutter with RVR status post ablation in March and history of atrial fibrillation, remains on amiodarone and Eliquis.  4.  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  He reports no active angina  with good exercise tolerance.  Medication Adjustments/Labs and Tests Ordered: Current medicines are reviewed at length with the patient today.  Concerns regarding medicines are outlined above.   Tests Ordered: Orders Placed This Encounter  Procedures  . ECHOCARDIOGRAM COMPLETE    Medication Changes: No orders of the defined types were placed in this encounter.   Disposition:  Follow up 3 months in the [place] office.  Signed, Satira Sark, MD, [facility] 01/19/2019 2:41 PM    [place] at [facility] [address], [place], [place] [address] Phone: [phone]; Fax: [phone]

## 2019-01-19 NOTE — Patient Instructions (Signed)
Medication Instructions: Your physician recommends that you continue on your current medications as directed. Please refer to the Current Medication list given to you today.   Labwork: None today  Procedures/Testing: Your physician has requested that you have an echocardiogram in 3 months. Echocardiography is a painless test that uses sound waves to create images of your heart. It provides your doctor with information about the size and shape of your heart and how well your heart's chambers and valves are working. This procedure takes approximately one hour. There are no restrictions for this procedure.    Follow-Up: 3 months with Dr.McDowell  Any Additional Special Instructions Will Be Listed Below (If Applicable).     If you need a refill on your cardiac medications before your next appointment, please call your pharmacy.        Thank you for choosing [facility] !

## 2019-02-25 ENCOUNTER — Other Ambulatory Visit: Payer: Self-pay | Admitting: Internal Medicine

## 2019-03-30 ENCOUNTER — Other Ambulatory Visit: Payer: Self-pay | Admitting: Cardiology

## 2019-04-05 ENCOUNTER — Other Ambulatory Visit: Payer: Self-pay | Admitting: Internal Medicine

## 2019-04-07 ENCOUNTER — Other Ambulatory Visit: Payer: Self-pay

## 2019-04-07 ENCOUNTER — Ambulatory Visit (INDEPENDENT_AMBULATORY_CARE_PROVIDER_SITE_OTHER): Payer: PRIVATE HEALTH INSURANCE

## 2019-04-07 DIAGNOSIS — I255 Ischemic cardiomyopathy: Secondary | ICD-10-CM | POA: Diagnosis not present

## 2019-04-10 IMAGING — DX PORTABLE CHEST - 1 VIEW
1 series · 1 of 1 positions shown · non-contrast
Comparison: 07/15/2018

CLINICAL DATA: Follow-up PICC line

EXAM:
PORTABLE CHEST 1 VIEW

[chest ap]
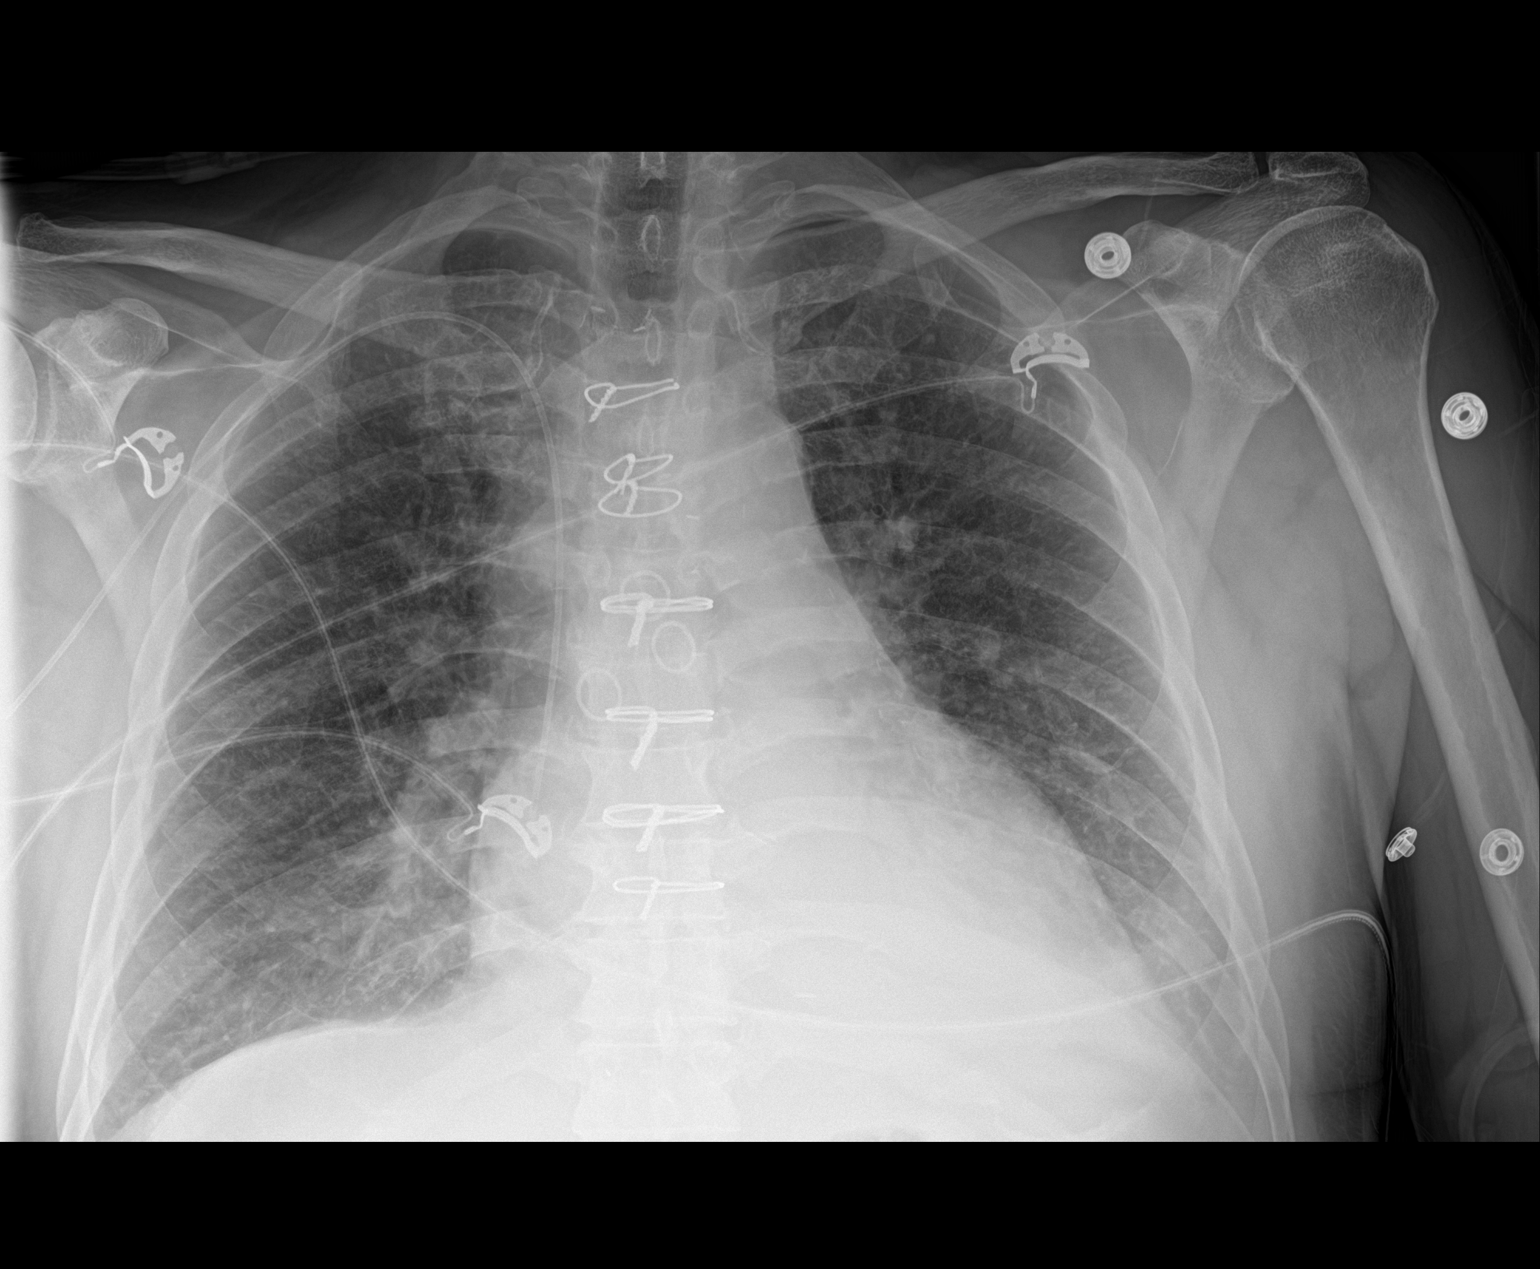

[1 of 1 positions shown; findings below may reference images not displayed]

FINDINGS: New right-sided PICC line is noted with the catheter tip just below
the cavoatrial junction. Cardiac shadow is stable. Postoperative
changes are seen. Improved aeration is noted with slight decrease in
the degree of vascular congestion. Left retrocardiac atelectasis is
again seen.
IMPRESSION: New right-sided PICC line just below the cavoatrial junction.

Persistent left basilar atelectasis.

Improvement in the degree of vascular congestion and edema.

## 2019-04-20 ENCOUNTER — Ambulatory Visit: Payer: PRIVATE HEALTH INSURANCE | Admitting: Cardiology

## 2019-04-22 ENCOUNTER — Ambulatory Visit (INDEPENDENT_AMBULATORY_CARE_PROVIDER_SITE_OTHER): Payer: PRIVATE HEALTH INSURANCE | Admitting: Cardiology

## 2019-04-22 ENCOUNTER — Other Ambulatory Visit: Payer: Self-pay

## 2019-04-22 ENCOUNTER — Encounter: Payer: Self-pay | Admitting: Cardiology

## 2019-04-22 ENCOUNTER — Encounter: Payer: Self-pay | Admitting: *Deleted

## 2019-04-22 VITALS — BP 110/52 | HR 52 | Ht 65.0 in | Wt 153.2 lb

## 2019-04-22 DIAGNOSIS — Z8679 Personal history of other diseases of the circulatory system: Secondary | ICD-10-CM

## 2019-04-22 DIAGNOSIS — I25119 Atherosclerotic heart disease of native coronary artery with unspecified angina pectoris: Secondary | ICD-10-CM

## 2019-04-22 DIAGNOSIS — I255 Ischemic cardiomyopathy: Secondary | ICD-10-CM | POA: Diagnosis not present

## 2019-04-22 DIAGNOSIS — I5022 Chronic systolic (congestive) heart failure: Secondary | ICD-10-CM

## 2019-04-22 NOTE — Patient Instructions (Signed)

## 2019-04-22 NOTE — Progress Notes (Signed)
Cardiology Office Note  Date: 04/22/2019   ID: Yida, Hyams 01/11/1956, MRN [id number]  PCP:  [name], MD  Cardiologist:  [name], MD Electrophysiologist:  [name], MD   Chief Complaint  Patient presents with  . Cardiac follow-up    History of Present Illness: [name] is a 63 y.o. male last seen in September.  He presents for a follow-up visit.  Still doing very well, no angina symptoms or palpitations, NYHA class I dyspnea.  No orthopnea or PND.  He plans to retire tomorrow from the [employer].  Recent follow-up echocardiogram showed improvement in LVEF to the range of 35 to 40%, moderate diastolic dysfunction, low normal RV contraction, mild mitral regurgitation.  We went over the results today.  I reviewed his medications which are stable and outlined below.  He states that he had recent lab work with Dr. Sherril Croon, we are requesting the results.  He has had no obvious intolerances or side effects.  I reviewed his heart rate and blood pressure recordings from home.  As noted previously he has declined defibrillator.  Past Medical History:  Diagnosis Date  . Atrial flutter [facility])    RFA March 2020 - Dr. [name]  . Cardiomyopathy (HCC)   . Coronary atherosclerosis of native coronary artery    Multivessel, LVEF 60%  . Essential hypertension   . Hyperlipidemia   . Type 2 diabetes mellitus (HCC)     Past Surgical History:  Procedure Laterality Date  . A-FLUTTER ABLATION N/A 07/17/2018   Procedure: A-FLUTTER ABLATION;  Surgeon: [name], MD;  Location: [facility] INVASIVE CV LAB;  Service: Cardiovascular;  Laterality: N/A;  . CORONARY ARTERY BYPASS GRAFT  09/09   LIMA to LAD, SVG to D1/OM1/OM2, SVG to D2, SVG to PDA  . RIGHT/LEFT HEART CATH AND CORONARY/GRAFT ANGIOGRAPHY N/A 07/17/2018   Procedure: RIGHT/LEFT HEART CATH AND CORONARY/GRAFT ANGIOGRAPHY;  Surgeon: Swaziland, Peter M, MD;  Location: [facility] INVASIVE CV LAB;  Service: Cardiovascular;   Laterality: N/A;    Current Outpatient Medications  Medication Sig Dispense Refill  . amiodarone (PACERONE) 200 MG tablet Take one tablet by mouth daily Monday through Saturday.  Do NOT take on Sunday. 90 tablet 3  . atorvastatin (LIPITOR) 80 MG tablet TAKE ONE TABLET BY MOUTH EVERY EVENING 30 tablet 6  . carvedilol (COREG) 3.125 MG tablet Take 1 tablet (3.125 mg total) by mouth 2 (two) times daily. 180 tablet 3  . digoxin (LANOXIN) 0.125 MG tablet TAKE ONE TABLET BY MOUTH DAILY 90 tablet 2  . ELIQUIS 5 MG TABS tablet TAKE ONE TABLET BY MOUTH TWICE DAILY 60 tablet 5  . FARXIGA 5 MG TABS tablet Take 5 mg by mouth daily.    . fexofenadine (ALLEGRA) 180 MG tablet Take 180 mg by mouth as needed for allergies or rhinitis.    . furosemide (LASIX) 20 MG tablet Take 1 tablet (20 mg total) by mouth daily. 30 tablet 3  . glyBURIDE-metformin (GLUCOVANCE) 5-500 MG per tablet Take 1 tablet by mouth 2 (two) times daily.     . nitroGLYCERIN (NITROSTAT) 0.4 MG SL tablet Place 1 tablet (0.4 mg total) under the tongue every 5 (five) minutes x 3 doses as needed for chest pain. 30 tablet 6  . Omega-3 Fatty Acids (FISH OIL) 1000 MG CAPS Take 1,000 mg by mouth 2 (two) times daily.     . pantoprazole (PROTONIX) 40 MG tablet Take 40 mg by mouth daily.      Marland Kitchen  sacubitril-valsartan (ENTRESTO) 24-26 MG Take 1 tablet by mouth 2 (two) times daily. 60 tablet 6   No current facility-administered medications for this visit.   Allergies:  Furosemide   Social History: The patient  reports that he quit smoking about 36 years ago. His smoking use included cigarettes. He has never used smokeless tobacco. He reports that he does not drink alcohol or use drugs.   ROS:  Please see the history of present illness. Otherwise, complete review of systems is positive for none.  All other systems are reviewed and negative.   Physical Exam: VS:  BP (!) 110/52   Pulse (!) 52   Ht 5\' 5"  (1.651 m)   Wt 153 lb 3.2 oz (69.5 kg)   SpO2  99%   BMI 25.49 kg/m , BMI Body mass index is 25.49 kg/m.  Wt Readings from Last 3 Encounters:  04/22/19 153 lb 3.2 oz (69.5 kg)  01/19/19 157 lb (71.2 kg)  12/14/18 152 lb (68.9 kg)    General: Patient appears comfortable at rest. HEENT: Conjunctiva and lids normal, wearing a mask. Neck: Supple, no elevated JVP or carotid bruits, no thyromegaly. Lungs: Clear to auscultation, nonlabored breathing at rest. Cardiac: Regular rate and rhythm, no S3 or significant systolic murmur, no pericardial rub. Abdomen: Soft, nontender, bowel sounds present. Extremities: No pitting edema, distal pulses 2+. Skin: Warm and dry. Musculoskeletal: No kyphosis. Neuropsychiatric: Alert and oriented x3, affect grossly appropriate.  ECG:  An ECG dated 12/11/2018 was personally reviewed today and demonstrated:  Sinus bradycardia with leftward axis, rule out old inferior infarct pattern.  Recent Labwork: 07/14/2018: B Natriuretic Peptide 1,618.1; Magnesium 2.3 07/15/2018: ALT 46; AST 86; TSH 1.227 07/18/2018: Hemoglobin 12.4; Platelets 291 07/24/2018: BUN 19; Creatinine, Ser 1.09; Potassium 6.0; Sodium 132     Component Value Date/Time   CHOL 173 07/15/2018 0355   TRIG 85 07/15/2018 0355   HDL 51 07/15/2018 0355   CHOLHDL 3.4 07/15/2018 0355   VLDL 17 07/15/2018 0355   LDLCALC 105 (H) 07/15/2018 0355    Other Studies Reviewed Today:  Echocardiogram 04/07/2019:  1. Left ventricular ejection fraction, by visual estimation, is 35 to 40%. The left ventricle has mild to moderately decreased function. There is no left ventricular hypertrophy.  2. Elevated left atrial pressure.  3. Left ventricular diastolic parameters are consistent with Grade II diastolic dysfunction (pseudonormalization).  4. The left ventricle demonstrates global hypokinesis.  5. Global right ventricle has low normal systolic function.The right ventricular size is normal. No increase in right ventricular wall thickness.  6. Left atrial  size was mildly dilated.  7. Right atrial size was mildly dilated.  8. The mitral valve is normal in structure. Mild mitral valve regurgitation. No evidence of mitral stenosis.  9. The tricuspid valve is normal in structure. Tricuspid valve regurgitation is trivial. 10. The aortic valve is tricuspid. Aortic valve regurgitation is not visualized. No evidence of aortic valve sclerosis or stenosis. 11. Pulmonic regurgitation is mild. 12. The pulmonic valve was not well visualized. Pulmonic valve regurgitation is mild. 13. Mildly elevated pulmonary artery systolic pressure. 14. The inferior vena cava is normal in size with greater than 50% respiratory variability, suggesting right atrial pressure of 3 mmHg.  Assessment and Plan:  1.  Ischemic cardiomyopathy, LVEF has improved to the range of 35 to 40% on medical therapy.  He has declined defibrillator.  2.  Chronic systolic heart failure.  Continues to do very well.  Continue Coreg, Lanoxin, Entresto, Lasix, and  Marcelline DeistFarxiga.  Not on Aldactone with prior hyperkalemia.  3.  History of atrial flutter with RVR status post ablation in March, also history of atrial fibrillation.  He remains on amiodarone and Eliquis.  Requesting recent lab work from Dr. Sherril CroonVyas.  No reported bleeding problems.  4.  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  He does not report any angina symptoms.  Not on aspirin given concurrent use of Eliquis.  Continue statin therapy.  Medication Adjustments/Labs and Tests Ordered: Current medicines are reviewed at length with the patient today.  Concerns regarding medicines are outlined above.   Tests Ordered: No orders of the defined types were placed in this encounter.   Medication Changes: No orders of the defined types were placed in this encounter.   Disposition:  Follow up 6 months in the [place] office.  Signed, Jonelle SidleSamuel G. Raesean Bartoletti, MD, [facility] 04/22/2019 9:10 AM    [facility] [address], [place], [place] [address] Phone: [phone](336) [phone]; Fax: [phone]

## 2019-06-05 ENCOUNTER — Other Ambulatory Visit: Payer: Self-pay | Admitting: Cardiology

## 2019-07-07 ENCOUNTER — Other Ambulatory Visit: Payer: Self-pay | Admitting: Internal Medicine

## 2019-07-07 ENCOUNTER — Telehealth: Payer: Self-pay | Admitting: Cardiology

## 2019-07-07 ENCOUNTER — Encounter: Payer: Self-pay | Admitting: *Deleted

## 2019-07-07 NOTE — Telephone Encounter (Signed)
This was received and given to nurse

## 2019-07-07 NOTE — Telephone Encounter (Signed)
Pt's wife left message stating that [facility] is sending paper work for prior [place] for pt's [employer]

## 2019-07-08 ENCOUNTER — Telehealth: Payer: Self-pay

## 2019-07-08 NOTE — Telephone Encounter (Signed)
Per Spouse([name]), Insurance is requesting authorization from Dr.McDowell, for mail in prescription.  Please call [name] [phone]   Thanks renee

## 2019-07-09 NOTE — Telephone Encounter (Signed)
Notification received via fax from [facility] that exception approval for entresto 24/26 mg from 07/08/2019 to 07/07/2020.

## 2019-07-19 ENCOUNTER — Other Ambulatory Visit: Payer: Self-pay

## 2019-07-19 MED ORDER — CARVEDILOL 3.125 MG PO TABS: 3.1250 mg | ORAL_TABLET | Freq: Two times a day (BID) | ORAL | 3 refills | Status: DC

## 2019-07-19 NOTE — Telephone Encounter (Signed)
Pt's spouse asking to call refill to [facility] [phone]. Pt only has enough for tomorrow.(carvedilol)  Pt's number [phone]   Thanks renee

## 2019-07-19 NOTE — Telephone Encounter (Signed)
Refill sent.

## 2019-10-07 ENCOUNTER — Other Ambulatory Visit: Payer: Self-pay

## 2019-10-07 MED ORDER — APIXABAN 5 MG PO TABS: 5.0000 mg | ORAL_TABLET | Freq: Two times a day (BID) | ORAL | 5 refills | Status: DC

## 2019-10-07 NOTE — Telephone Encounter (Signed)
refilled eliquis 

## 2019-10-21 ENCOUNTER — Other Ambulatory Visit: Payer: Self-pay | Admitting: Cardiology

## 2019-11-18 ENCOUNTER — Other Ambulatory Visit: Payer: Self-pay | Admitting: Internal Medicine

## 2019-12-20 ENCOUNTER — Encounter: Payer: Self-pay | Admitting: Cardiology

## 2019-12-20 ENCOUNTER — Encounter: Payer: Self-pay | Admitting: *Deleted

## 2019-12-20 ENCOUNTER — Other Ambulatory Visit: Payer: Self-pay | Admitting: Cardiology

## 2019-12-20 ENCOUNTER — Ambulatory Visit (INDEPENDENT_AMBULATORY_CARE_PROVIDER_SITE_OTHER): Payer: 59 | Admitting: Cardiology

## 2019-12-20 VITALS — BP 102/48 | HR 48 | Ht 65.0 in | Wt 148.0 lb

## 2019-12-20 DIAGNOSIS — I5022 Chronic systolic (congestive) heart failure: Secondary | ICD-10-CM | POA: Diagnosis not present

## 2019-12-20 DIAGNOSIS — I255 Ischemic cardiomyopathy: Secondary | ICD-10-CM

## 2019-12-20 DIAGNOSIS — I25119 Atherosclerotic heart disease of native coronary artery with unspecified angina pectoris: Secondary | ICD-10-CM | POA: Diagnosis not present

## 2019-12-20 MED ORDER — AMIODARONE HCL 100 MG PO TABS: 100.0000 mg | ORAL_TABLET | Freq: Every day | ORAL | 1 refills | Status: DC

## 2019-12-20 NOTE — Patient Instructions (Signed)
Your physician wants you to follow-up in: 6 MONTHS WITH DR MCDOWELL You will receive a reminder letter in the mail two months in advance. If you don't receive a letter, please call our office to schedule the follow-up appointment.  Your physician has recommended you make the following change in your medication:   STOP DIGOXIN   DECREASE AMIODARONE 100 MG DAILY   Thank you for choosing  [facility]!!

## 2019-12-20 NOTE — Progress Notes (Signed)
Cardiology Office Note  Date: 12/20/2019   ID: Treson, Laura [birth year], MRN [id number]  PCP:  [name], MD  Cardiologist:  [name], MD Electrophysiologist:  [name], MD   Chief Complaint  Patient presents with  . Cardiac follow-up    History of Present Illness: [name] is a 64 y.o. male last seen in December 2020.  He presents for a routine visit, doing well.  He does not report any exertional chest pain or palpitations, has NYHA class II dyspnea, no major functional limitation at this time.  He continues to enjoy collecting and working on classic cars.  I reviewed his medications which are outlined below.  Heart rate in the high 40s to low 50s.  I personally reviewed his ECG which shows sinus bradycardia at 51 bpm with old inferior infarct pattern.  I talked with him today about stopping Lanoxin and reducing his amiodarone to 100 mg daily, otherwise continue stable regimen.  Also requesting interval lab work from Dr. Sherril Croon.  Past Medical History:  Diagnosis Date  . Atrial flutter [facility])    RFA March 2020 - Dr. [name]  . Cardiomyopathy (HCC)   . Coronary atherosclerosis of native coronary artery    Multivessel, LVEF 60%  . Essential hypertension   . Hyperlipidemia   . Type 2 diabetes mellitus (HCC)     Past Surgical History:  Procedure Laterality Date  . A-FLUTTER ABLATION N/A 07/17/2018   Procedure: A-FLUTTER ABLATION;  Surgeon: [name], MD;  Location: [facility] INVASIVE CV LAB;  Service: Cardiovascular;  Laterality: N/A;  . CORONARY ARTERY BYPASS GRAFT  09/09   LIMA to LAD, SVG to D1/OM1/OM2, SVG to D2, SVG to PDA  . RIGHT/LEFT HEART CATH AND CORONARY/GRAFT ANGIOGRAPHY N/A 07/17/2018   Procedure: RIGHT/LEFT HEART CATH AND CORONARY/GRAFT ANGIOGRAPHY;  Surgeon: Swaziland, Peter M, MD;  Location: [facility] INVASIVE CV LAB;  Service: Cardiovascular;  Laterality: N/A;    Current Outpatient Medications  Medication Sig Dispense Refill  . apixaban (ELIQUIS)  5 MG TABS tablet Take 1 tablet (5 mg total) by mouth 2 (two) times daily. 60 tablet 5  . atorvastatin (LIPITOR) 80 MG tablet TAKE ONE TABLET BY MOUTH EVERY EVENING 30 tablet 0  . carvedilol (COREG) 3.125 MG tablet Take 1 tablet (3.125 mg total) by mouth 2 (two) times daily. 180 tablet 3  . ENTRESTO 24-26 MG TAKE ONE TABLET BY MOUTH TWICE DAILY 60 tablet 6  . FARXIGA 5 MG TABS tablet Take 5 mg by mouth daily.    . fexofenadine (ALLEGRA) 180 MG tablet Take 180 mg by mouth as needed for allergies or rhinitis.    . furosemide (LASIX) 20 MG tablet Take 1 tablet (20 mg total) by mouth daily. 30 tablet 3  . glyBURIDE-metformin (GLUCOVANCE) 5-500 MG per tablet Take 1 tablet by mouth 2 (two) times daily.     . nitroGLYCERIN (NITROSTAT) 0.4 MG SL tablet Place 1 tablet (0.4 mg total) under the tongue every 5 (five) minutes x 3 doses as needed for chest pain. 30 tablet 6  . Omega-3 Fatty Acids (FISH OIL) 1000 MG CAPS Take 1,000 mg by mouth 2 (two) times daily.     . pantoprazole (PROTONIX) 40 MG tablet Take 40 mg by mouth daily.      Marland Kitchen amiodarone (PACERONE) 100 MG tablet Take 1 tablet (100 mg total) by mouth daily. 90 tablet 1   No current facility-administered medications for this visit.   Allergies:  Furosemide  ROS:   No palpitations or syncope.  Physical Exam: VS:  BP (!) 102/48   Pulse (!) 48   Ht 5\' 5"  (1.651 m)   Wt 148 lb (67.1 kg)   SpO2 98%   BMI 24.63 kg/m , BMI Body mass index is 24.63 kg/m.  Wt Readings from Last 3 Encounters:  12/20/19 148 lb (67.1 kg)  04/22/19 153 lb 3.2 oz (69.5 kg)  01/19/19 157 lb (71.2 kg)    General: Patient appears comfortable at rest. HEENT: Conjunctiva and lids normal, wearing a mask. Neck: Supple, no elevated JVP or carotid bruits, no thyromegaly. Lungs: Clear to auscultation, nonlabored breathing at rest. Cardiac: Regular rate and rhythm, no S3 or significant systolic murmur, no pericardial rub. Extremities: No pitting edema, distal pulses  2+.  ECG:  An ECG dated 12/11/2018 was personally reviewed today and demonstrated:  Sinus bradycardia with leftward axis, rule out old inferior infarct pattern.  Recent Labwork:  December 2020: Hemoglobin 14.7, platelets 223, TSH 2.53, BUN 18, creatinine 0.83, potassium 4.1, AST 15, ALT 28, cholesterol 197, triglycerides 80, HDL 39, LDL 143  Other Studies Reviewed Today:  Echocardiogram 04/07/2019: 1. Left ventricular ejection fraction, by visual estimation, is 35 to 40%. The left ventricle has mild to moderately decreased function. There is no left ventricular hypertrophy. 2. Elevated left atrial pressure. 3. Left ventricular diastolic parameters are consistent with Grade II diastolic dysfunction (pseudonormalization). 4. The left ventricle demonstrates global hypokinesis. 5. Global right ventricle has low normal systolic function.The right ventricular size is normal. No increase in right ventricular wall thickness. 6. Left atrial size was mildly dilated. 7. Right atrial size was mildly dilated. 8. The mitral valve is normal in structure. Mild mitral valve regurgitation. No evidence of mitral stenosis. 9. The tricuspid valve is normal in structure. Tricuspid valve regurgitation is trivial. 10. The aortic valve is tricuspid. Aortic valve regurgitation is not visualized. No evidence of aortic valve sclerosis or stenosis. 11. Pulmonic regurgitation is mild. 12. The pulmonic valve was not well visualized. Pulmonic valve regurgitation is mild. 13. Mildly elevated pulmonary artery systolic pressure. 14. The inferior vena cava is normal in size with greater than 50% respiratory variability, suggesting right atrial pressure of 3 mmHg.  Assessment and Plan:  1.  Ischemic cardiomyopathy, LVEF 35 to 40% by last assessment.  Plan to stop Lanoxin in light of bradycardia and continued use of amiodarone as well as Coreg.  Maintain 04/09/2019 and Sherryll Burger.  He is not on Aldactone and due to  previous hyperkalemia.  Requesting interval lab work from PCP.  He declines ICD.  We will consider a follow-up echocardiogram around the time of his next visit.  2.  Chronic systolic heart failure, clinically stable, weight is down compared to last visit and he reports NYHA class II dyspnea.  Continue Lasix.  3.  History of atrial flutter status post ablation in March, also history of atrial fibrillation.  Continue Eliquis for stroke prophylaxis, CHA2DS2-VASc score is 3.  Reduce amiodarone to 100 mg daily.  4.  Multivessel CAD status post CABG with documented graft disease and plan for medical therapy.  He reports no angina at this time.  Continue Lipitor.  Medication Adjustments/Labs and Tests Ordered: Current medicines are reviewed at length with the patient today.  Concerns regarding medicines are outlined above.   Tests Ordered: Orders Placed This Encounter  Procedures  . EKG 12-Lead    Medication Changes: Meds ordered this encounter  Medications  . amiodarone (PACERONE) 100 MG  tablet    Sig: Take 1 tablet (100 mg total) by mouth daily.    Dispense:  90 tablet    Refill:  1    Disposition:  Follow up 6 months in the [place] office.  Signed, Jonelle Sidle, MD, [facility] 12/20/2019 8:51 AM    [facility] at [facility] [address] [place], [place], [place] [address] Phone: [phone]; Fax: [phone]

## 2020-01-03 ENCOUNTER — Other Ambulatory Visit: Payer: Self-pay | Admitting: Cardiology

## 2020-04-03 ENCOUNTER — Other Ambulatory Visit: Payer: Self-pay | Admitting: Cardiology

## 2020-04-06 ENCOUNTER — Telehealth: Payer: Self-pay | Admitting: *Deleted

## 2020-04-06 ENCOUNTER — Telehealth: Payer: Self-pay | Admitting: Cardiology

## 2020-04-06 DIAGNOSIS — Z79899 Other long term (current) drug therapy: Secondary | ICD-10-CM

## 2020-04-06 DIAGNOSIS — Z01810 Encounter for preprocedural cardiovascular examination: Secondary | ICD-10-CM

## 2020-04-06 NOTE — Telephone Encounter (Signed)
[name] (wife) called stating that patient is having upcoming laser surgery on his eye.  Instructed patient's wife to contact [facility] [facility] to notify our office.

## 2020-04-06 NOTE — Telephone Encounter (Signed)
   [facility] Pre-operative Risk Assessment     Request for surgical clearance:  1. What type of surgery is being performed? PANRETINAL PHOTOCOAGULATION LASER IN RIGHT EYE   2. When is this surgery scheduled? [facility]    3. What type of clearance is required (medical clearance vs. Pharmacy clearance to hold med vs. Both)? BOTH  4. Are there any medications that need to be held prior to surgery and how long? ELIQUIS TBD    5. Practice name and name of physician performing surgery? [facility] Kyle Stalls, MD    6. What is the office phone number? [phone]   7.   What is the office fax number? [phone]  8.   Anesthesia type (None, local, MAC, general) NOT KNOWN    [name] 04/06/2020, 4:46 PM  _________________________________________________________________   (provider comments below)

## 2020-04-07 NOTE — Telephone Encounter (Signed)
Clinical pharmacist to review Eliquis 

## 2020-04-07 NOTE — Telephone Encounter (Signed)
Patient with diagnosis of A Fib on Eliquis for anticoagulation.    Procedure: PANRETINAL PHOTOCOAGULATION LASER IN RIGHT EYE   Date of procedure: 04/25/20   CHA2DS2-VASc Score = 4  This indicates a 4.8% annual risk of stroke. The patient's score is based upon: CHF History: Yes HTN History: Yes Diabetes History: Yes Stroke History: No Vascular Disease History: Yes Age Score: 0 Gender Score: 0    CrCl ? Platelet count ?  Last lab work from 12/25/18.  Can not clear patient at this time until lab work updated.

## 2020-04-10 NOTE — Telephone Encounter (Signed)
Per Edd Fabian FNP-C: Please order a CBC and BMP. We need these labs in order to provide clearance for the patient. Thank you.   LM2CB, future orders entered

## 2020-04-11 NOTE — Telephone Encounter (Signed)
Pt returned call says he will have labs done at [facility] lab in [place]

## 2020-04-13 ENCOUNTER — Other Ambulatory Visit: Payer: Self-pay

## 2020-04-13 ENCOUNTER — Other Ambulatory Visit (HOSPITAL_COMMUNITY)
Admission: RE | Admit: 2020-04-13 | Discharge: 2020-04-13 | Disposition: A | Discharge status: Home / Self Care | Payer: 59 | Source: Ambulatory Visit | Attending: General Practice | Admitting: General Practice

## 2020-04-13 DIAGNOSIS — Z01810 Encounter for preprocedural cardiovascular examination: Secondary | ICD-10-CM | POA: Diagnosis present

## 2020-04-13 DIAGNOSIS — Z79899 Other long term (current) drug therapy: Secondary | ICD-10-CM | POA: Insufficient documentation

## 2020-04-13 LAB — CBC
HCT: 48.9 % (ref 39.0–52.0)
Hemoglobin: 15.9 g/dL (ref 13.0–17.0)
MCH: 31.2 pg (ref 26.0–34.0)
MCHC: 32.5 g/dL (ref 30.0–36.0)
MCV: 95.9 fL (ref 80.0–100.0)
Platelets: 259 10*3/uL (ref 150–400)
RBC: 5.1 MIL/uL (ref 4.22–5.81)
RDW: 12.7 % (ref 11.5–15.5)
WBC: 7.4 10*3/uL (ref 4.0–10.5)
nRBC: 0 % (ref 0.0–0.2)

## 2020-04-13 LAB — BASIC METABOLIC PANEL
Anion gap: 9 (ref 5–15)
BUN: 29 mg/dL — ABNORMAL HIGH (ref 8–23)
CO2: 26 mmol/L (ref 22–32)
Calcium: 9.3 mg/dL (ref 8.9–10.3)
Chloride: 104 mmol/L (ref 98–111)
Creatinine, Ser: 1.05 mg/dL (ref 0.61–1.24)
GFR, Estimated: >60 mL/min (ref >60)
Glucose, Bld: 157 mg/dL — ABNORMAL HIGH (ref 70–99)
Potassium: 5 mmol/L (ref 3.5–5.1)
Sodium: 139 mmol/L (ref 135–145)

## 2020-04-13 NOTE — Telephone Encounter (Signed)
Left message to call back.  04/13/2020 at 11:05 AM.  Patient needs call back

## 2020-04-13 NOTE — Telephone Encounter (Signed)
   Primary Cardiologist: [name], MD  Chart reviewed as part of pre-operative protocol coverage. Given past medical history and time since last visit, based on ACC/AHA guidelines, ALCIDE MEMOLI would be at acceptable risk for the planned procedure without further cardiovascular testing.   Patient was advised that if he develops new symptoms prior to surgery to contact our office to arrange a follow-up appointment.  He verbalized understanding.  Patient with diagnosis of A Fib on Eliquis for anticoagulation.    Procedure: PANRETINAL PHOTOCOAGULATION LASER IN RIGHT EYE  Date of procedure: 04/25/20   CHA2DS2-VASc Score = 4  This indicates a 4.8% annual risk of stroke. The patient's score is based upon: CHF History: Yes HTN History: Yes Diabetes History: Yes Stroke History: No Vascular Disease History: Yes Age Score: 0 Gender Score: 0   CrCl 38mL/min, platelets are stable.   Ok to hold Eliquis for up to 2 days prior to procedure.  I will route this recommendation to the requesting party via Epic fax function and remove from pre-op pool.  Please call with questions.  Thomasene Ripple. Gabbriella Presswood NP-C    04/13/2020, 1:52 PM [facility] [address] Office [phone] Fax [phone]

## 2020-04-13 NOTE — Telephone Encounter (Signed)
[name] is returning [name]'s call.

## 2020-04-13 NOTE — Telephone Encounter (Signed)
Patient needs call back.  Left voice message 12/23 at 1339

## 2020-04-13 NOTE — Telephone Encounter (Signed)
CrCl 63mL/min, platelets are stable. CHADS2VASc score previously calculated at 4, no hx of stroke.   Ok to hold Eliquis for up to 2 days prior to procedure.

## 2020-04-13 NOTE — Telephone Encounter (Signed)
[name] has no cardiac complaints at this time.  He denies chest pain, shortness of breath, syncope, presyncope, lower extremity edema, hematuria, hemoptysis, melena, orthopnea, and PND.  He states overall he feels very well and continues to be very physically active working on cars.

## 2020-05-02 ENCOUNTER — Telehealth: Payer: Self-pay | Admitting: Cardiology

## 2020-05-02 NOTE — Telephone Encounter (Signed)
Patients wife called and stated he had eye surgery last week and was advised to stop taking Elequis, patient is having a tooth removed this week, and surgery on the other eye next week and patient and wife want to know does patient need to stop elequis for these upcoming procedures?

## 2020-05-02 NOTE — Telephone Encounter (Signed)
Yes, would follow similar protocol as recommended for prior eye surgery.

## 2020-05-02 NOTE — Telephone Encounter (Signed)
I left message for wife that they should hold eliquis 2 days prior to upcoming eye procedure just like before.

## 2020-05-02 NOTE — Telephone Encounter (Signed)
Patient was advise by [place] to hold eliquis for 2 days for previous laser eye surgery  Procedure:PANRETINAL PHOTOCOAGULATION LASER IN RIGHT EYE   He is now scheduled to have left eye done and asks if he should again hold eliquis for the 2 days prior   He also has an abscessed molar and needs removal by family dentist.I told wife to have dentist forward their paperwork to [place] to be completed.

## 2020-06-20 ENCOUNTER — Other Ambulatory Visit: Payer: Self-pay | Admitting: Cardiology

## 2020-06-20 ENCOUNTER — Ambulatory Visit (INDEPENDENT_AMBULATORY_CARE_PROVIDER_SITE_OTHER): Payer: 59 | Admitting: Cardiology

## 2020-06-20 ENCOUNTER — Encounter: Payer: Self-pay | Admitting: Cardiology

## 2020-06-20 VITALS — BP 98/44 | HR 56 | Ht 66.0 in | Wt 151.0 lb

## 2020-06-20 DIAGNOSIS — I255 Ischemic cardiomyopathy: Secondary | ICD-10-CM

## 2020-06-20 DIAGNOSIS — I25119 Atherosclerotic heart disease of native coronary artery with unspecified angina pectoris: Secondary | ICD-10-CM

## 2020-06-20 DIAGNOSIS — I4891 Unspecified atrial fibrillation: Secondary | ICD-10-CM | POA: Diagnosis not present

## 2020-06-20 DIAGNOSIS — I4892 Unspecified atrial flutter: Secondary | ICD-10-CM | POA: Diagnosis not present

## 2020-06-20 NOTE — Patient Instructions (Addendum)

## 2020-06-20 NOTE — Progress Notes (Signed)
Cardiology Office Note  Date: 06/20/2020   ID: [name], DOB 19-Aug-1955, MRN [id number]  PCP:  [name], MD  Cardiologist:  [name], MD Electrophysiologist:  [name], MD   Chief Complaint  Patient presents with  . Cardiac follow-up    History of Present Illness: [name] is a 65 y.o. male last seen in August 2021.  He is here with his wife today for a follow-up visit.  Reports NYHA class I dyspnea, no exertional chest pain, no palpitations or syncope.  Still enjoys outside chores, also working on his vintage cars.  He did slip and fall with the recent snow last month, injured his shoulder and neck, still having some pain associated with that and paresthesias in his right hand.  I reviewed his cardiac medications which are outlined below and stable.  He does not report any spontaneous bleeding problems on Eliquis.  I reviewed his lab work from December 2021.  We went over his home blood pressure and heart rate checks, overall doing well.  Past Medical History:  Diagnosis Date  . Atrial flutter [facility])    RFA March 2020 - Dr. [name]  . Cardiomyopathy (HCC)   . Coronary atherosclerosis of native coronary artery    Multivessel, LVEF 60%  . Essential hypertension   . Hyperlipidemia   . Type 2 diabetes mellitus (HCC)     Past Surgical History:  Procedure Laterality Date  . A-FLUTTER ABLATION N/A 07/17/2018   Procedure: A-FLUTTER ABLATION;  Surgeon: [name], MD;  Location: [facility] INVASIVE CV LAB;  Service: Cardiovascular;  Laterality: N/A;  . CORONARY ARTERY BYPASS GRAFT  09/09   LIMA to LAD, SVG to D1/OM1/OM2, SVG to D2, SVG to PDA  . RIGHT/LEFT HEART CATH AND CORONARY/GRAFT ANGIOGRAPHY N/A 07/17/2018   Procedure: RIGHT/LEFT HEART CATH AND CORONARY/GRAFT ANGIOGRAPHY;  Surgeon: Swaziland, Peter M, MD;  Location: [facility] INVASIVE CV LAB;  Service: Cardiovascular;  Laterality: N/A;    Current Outpatient Medications  Medication Sig Dispense Refill  .  amiodarone (PACERONE) 100 MG tablet Take 1 tablet (100 mg total) by mouth daily. 90 tablet 1  . atorvastatin (LIPITOR) 80 MG tablet TAKE ONE TABLET BY MOUTH EVERY EVENING 30 tablet 6  . carvedilol (COREG) 3.125 MG tablet TAKE ONE TABLET BY MOUTH TWICE DAILY 180 tablet 3  . ELIQUIS 5 MG TABS tablet TAKE ONE TABLET BY MOUTH TWICE DAILY 60 tablet 6  . ENTRESTO 24-26 MG TAKE ONE TABLET BY MOUTH TWICE DAILY 60 tablet 6  . FARXIGA 5 MG TABS tablet Take 5 mg by mouth daily.    . fexofenadine (ALLEGRA) 180 MG tablet Take 180 mg by mouth as needed for allergies or rhinitis.    Marland Kitchen glyBURIDE-metformin (GLUCOVANCE) 5-500 MG per tablet Take 1 tablet by mouth 2 (two) times daily.     . nitroGLYCERIN (NITROSTAT) 0.4 MG SL tablet DISSOLVE ONE TABLET UNDER TONGUE EVERY 5 MINUTES UP TO 3 DOSES AS NEEDED FOR CHEST PAIN 25 tablet 6  . Omega-3 Fatty Acids (FISH OIL) 1000 MG CAPS Take 1,000 mg by mouth 2 (two) times daily.    . pantoprazole (PROTONIX) 40 MG tablet Take 40 mg by mouth daily.    . furosemide (LASIX) 20 MG tablet Take 1 tablet (20 mg total) by mouth daily. 30 tablet 3   No current facility-administered medications for this visit.   Allergies:  Furosemide   ROS: No syncope.  Physical Exam: VS:  BP (!) 98/44  Pulse (!) 56   Ht 5\' 6"  (1.676 m)   Wt 151 lb (68.5 kg)   SpO2 98%   BMI 24.37 kg/m , BMI Body mass index is 24.37 kg/m.  Wt Readings from Last 3 Encounters:  06/20/20 151 lb (68.5 kg)  12/20/19 148 lb (67.1 kg)  04/22/19 153 lb 3.2 oz (69.5 kg)    General: Patient appears comfortable at rest. HEENT: Conjunctiva and lids normal, wearing a mask. Neck: Supple, no elevated JVP or carotid bruits, no thyromegaly. Lungs: Clear to auscultation, nonlabored breathing at rest. Cardiac: Regular rate and rhythm, no S3 or significant systolic murmur, no pericardial rub. Extremities: No pitting edema.  ECG:  An ECG dated 12/20/2019 was personally reviewed today and demonstrated:  Sinus  bradycardia with old inferior infarct pattern.  Recent Labwork: 04/13/2020: BUN 29; Creatinine, Ser 1.05; Hemoglobin 15.9; Platelets 259; Potassium 5.0; Sodium 139     Component Value Date/Time   CHOL 173 07/15/2018 0355   TRIG 85 07/15/2018 0355   HDL 51 07/15/2018 0355   CHOLHDL 3.4 07/15/2018 0355   VLDL 17 07/15/2018 0355   LDLCALC 105 (H) 07/15/2018 0355    Other Studies Reviewed Today:  Echocardiogram 04/07/2019: 1. Left ventricular ejection fraction, by visual estimation, is 35 to 40%. The left ventricle has mild to moderately decreased function. There is no left ventricular hypertrophy. 2. Elevated left atrial pressure. 3. Left ventricular diastolic parameters are consistent with Grade II diastolic dysfunction (pseudonormalization). 4. The left ventricle demonstrates global hypokinesis. 5. Global right ventricle has low normal systolic function.The right ventricular size is normal. No increase in right ventricular wall thickness. 6. Left atrial size was mildly dilated. 7. Right atrial size was mildly dilated. 8. The mitral valve is normal in structure. Mild mitral valve regurgitation. No evidence of mitral stenosis. 9. The tricuspid valve is normal in structure. Tricuspid valve regurgitation is trivial. 10. The aortic valve is tricuspid. Aortic valve regurgitation is not visualized. No evidence of aortic valve sclerosis or stenosis. 11. Pulmonic regurgitation is mild. 12. The pulmonic valve was not well visualized. Pulmonic valve regurgitation is mild. 13. Mildly elevated pulmonary artery systolic pressure. 14. The inferior vena cava is normal in size with greater than 50% respiratory variability, suggesting right atrial pressure of 3 mmHg.  Assessment and Plan:  1.  Ischemic cardiomyopathy with chronic systolic heart failure.  Clinically stable on medical therapy.  LVEF 35 to 40% as of December 2020, follow-up echocardiogram will be obtained.  He has declined ICD.   Continue Coreg, Entresto, Lasix, and 04-27-2004.  He is not on Aldactone with previous hyperkalemia.  2.  Atrial flutter status post ablation in March 2021, also history of atrial fibrillation.  CHA2DS2-VASc score is 3 and he remains on Eliquis for stroke prophylaxis as well as low-dose amiodarone.  Heart rate regular today.  3.  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  No active angina at this time.  Medication Adjustments/Labs and Tests Ordered: Current medicines are reviewed at length with the patient today.  Concerns regarding medicines are outlined above.   Tests Ordered: Orders Placed This Encounter  Procedures  . ECHOCARDIOGRAM COMPLETE    Medication Changes: No orders of the defined types were placed in this encounter.   Disposition:  Follow up 6 months in the [place] office.  Signed, [place], MD, [facility] 06/20/2020 4:59 PM    [facility] at [facility] [address] [place], [place], [place]  [place] Phone: [phone]; Fax: [phone]

## 2020-07-18 ENCOUNTER — Other Ambulatory Visit: Payer: Self-pay | Admitting: Cardiology

## 2020-07-19 ENCOUNTER — Ambulatory Visit (INDEPENDENT_AMBULATORY_CARE_PROVIDER_SITE_OTHER): Payer: 59

## 2020-07-19 DIAGNOSIS — I25119 Atherosclerotic heart disease of native coronary artery with unspecified angina pectoris: Secondary | ICD-10-CM

## 2020-07-19 DIAGNOSIS — I255 Ischemic cardiomyopathy: Secondary | ICD-10-CM | POA: Diagnosis not present

## 2020-07-19 LAB — ECHOCARDIOGRAM COMPLETE
AR max vel: 2.44 cm2
AV Area VTI: 2.96 cm2
AV Area mean vel: 2.72 cm2
AV Mean grad: 2.4 mmHg
AV Peak grad: 5.1 mmHg
Ao pk vel: 1.13 m/s
Area-P 1/2: 2.07 cm2
Calc EF: 47.5 %
MV M vel: 4.32 m/s
MV Peak grad: 74.5 mmHg
S' Lateral: 4.11 cm
Single Plane A2C EF: 48.6 %
Single Plane A4C EF: 45.4 %

## 2020-07-20 ENCOUNTER — Telehealth: Payer: Self-pay | Admitting: *Deleted

## 2020-07-20 NOTE — Telephone Encounter (Signed)
-----   Message from Jonelle Sidle, MD sent at 07/19/2020 10:53 AM EDT ----- Results reviewed.  LVEF has improved to the range of 40 to 45% on medical therapy.  This is good news overall.  Although not normal, this takes him out of the range for even considering an ICD at this point.  Keep scheduled follow-up.

## 2020-07-20 NOTE — Telephone Encounter (Signed)
Patient informed. Copy sent to PCP °

## 2020-07-27 ENCOUNTER — Other Ambulatory Visit: Payer: Self-pay | Admitting: Cardiology

## 2020-10-20 ENCOUNTER — Other Ambulatory Visit: Payer: Self-pay | Admitting: *Deleted

## 2020-10-20 MED ORDER — APIXABAN 5 MG PO TABS: 5.0000 mg | ORAL_TABLET | Freq: Two times a day (BID) | ORAL | 6 refills | Status: DC

## 2020-12-26 ENCOUNTER — Ambulatory Visit: Payer: 59 | Admitting: Cardiology

## 2020-12-28 NOTE — Progress Notes (Signed)
Cardiology Office Note  Date: 12/29/2020   ID: [name], DOB [birth year], MRN [id number]  PCP:  [name], MD  Cardiologist:  [name], MD Electrophysiologist:  [name], MD   Chief Complaint  Patient presents with   Cardiac follow-up    History of Present Illness: [name] is a 65 y.o. male last seen in March.  He is here today with his wife for a follow-up visit.  He continues to do very well, NYHA class I dyspnea, no angina symptoms or palpitations.  Continues to enjoy working with his vintage cars.  Follow-up echocardiogram in March revealed improvement in LVEF to the range of 40 to 45%, mild diastolic dysfunction, mild RV dysfunction.  We discussed follow-up lab work on amiodarone and Eliquis.  He does not report any spontaneous bleeding problems.  I personally reviewed his ECG today which shows sinus bradycardia with nonspecific T wave changes.  Past Medical History:  Diagnosis Date   Atrial flutter [facility])    RFA March 2020 - Dr. [name]   Cardiomyopathy [facility])    Coronary atherosclerosis of native coronary artery    Multivessel, LVEF 60%   Essential hypertension    Hyperlipidemia    Type 2 diabetes mellitus [facility])     Past Surgical History:  Procedure Laterality Date   A-FLUTTER ABLATION N/A 07/17/2018   Procedure: A-FLUTTER ABLATION;  Surgeon: [name], MD;  Location: [facility] INVASIVE CV LAB;  Service: Cardiovascular;  Laterality: N/A;   CORONARY ARTERY BYPASS GRAFT  09/09   LIMA to LAD, SVG to D1/OM1/OM2, SVG to D2, SVG to PDA   RIGHT/LEFT HEART CATH AND CORONARY/GRAFT ANGIOGRAPHY N/A 07/17/2018   Procedure: RIGHT/LEFT HEART CATH AND CORONARY/GRAFT ANGIOGRAPHY;  Surgeon: Swaziland, Peter M, MD;  Location: [facility] INVASIVE CV LAB;  Service: Cardiovascular;  Laterality: N/A;    Current Outpatient Medications  Medication Sig Dispense Refill   amiodarone (PACERONE) 200 MG tablet TAKE 1/2 TABLET BY MOUTH DAILY 45 tablet 1   apixaban (ELIQUIS) 5 MG  TABS tablet Take 1 tablet (5 mg total) by mouth 2 (two) times daily. 60 tablet 6   atorvastatin (LIPITOR) 80 MG tablet TAKE ONE TABLET BY MOUTH EVERY EVENING 30 tablet 6   carvedilol (COREG) 3.125 MG tablet TAKE ONE TABLET BY MOUTH TWICE DAILY 180 tablet 3   ENTRESTO 24-26 MG TAKE ONE TABLET BY MOUTH TWICE DAILY 60 tablet 6   FARXIGA 5 MG TABS tablet Take 5 mg by mouth daily.     fexofenadine (ALLEGRA) 180 MG tablet Take 180 mg by mouth as needed for allergies or rhinitis.     furosemide (LASIX) 20 MG tablet Take 1 tablet (20 mg total) by mouth daily. 30 tablet 3   glyBURIDE-metformin (GLUCOVANCE) 5-500 MG per tablet Take 1 tablet by mouth 2 (two) times daily. 1.5 tablets in the am 2 tablets in the pm     nitroGLYCERIN (NITROSTAT) 0.4 MG SL tablet DISSOLVE ONE TABLET UNDER TONGUE EVERY 5 MINUTES UP TO 3 DOSES AS NEEDED FOR CHEST PAIN 25 tablet 6   Omega-3 Fatty Acids (FISH OIL) 1000 MG CAPS Take 1,000 mg by mouth 2 (two) times daily.     pantoprazole (PROTONIX) 40 MG tablet Take 40 mg by mouth daily.     No current facility-administered medications for this visit.   Allergies:  Furosemide   ROS: No palpitations or syncope.  Physical Exam: VS:  BP (!) 114/58   Pulse (!) 57   Ht  5\' 6"  (1.676 m)   Wt 153 lb 12.8 oz (69.8 kg)   SpO2 99%   BMI 24.82 kg/m , BMI Body mass index is 24.82 kg/m.  Wt Readings from Last 3 Encounters:  12/29/20 153 lb 12.8 oz (69.8 kg)  06/20/20 151 lb (68.5 kg)  12/20/19 148 lb (67.1 kg)    General: Patient appears comfortable at rest. HEENT: Conjunctiva and lids normal, wearing a mask. Neck: Supple, no elevated JVP or carotid bruits, no thyromegaly. Lungs: Clear to auscultation, nonlabored breathing at rest. Cardiac: Regular rate and rhythm, no S3 or significant systolic murmur, no pericardial rub. Extremities: No pitting edema.  ECG:  An ECG dated 12/20/2019 was personally reviewed today and demonstrated:  Sinus bradycardia with old inferior infarct  pattern.  Recent Labwork: 04/13/2020: BUN 29; Creatinine, Ser 1.05; Hemoglobin 15.9; Platelets 259; Potassium 5.0; Sodium 139     Component Value Date/Time   CHOL 173 07/15/2018 0355   TRIG 85 07/15/2018 0355   HDL 51 07/15/2018 0355   CHOLHDL 3.4 07/15/2018 0355   VLDL 17 07/15/2018 0355   LDLCALC 105 (H) 07/15/2018 0355    Other Studies Reviewed Today:  Echocardiogram 07/19/2020:  1. Left ventricular ejection fraction, by estimation, is 40 to 45%. The  left ventricle has mildly decreased function. The left ventricle  demonstrates global hypokinesis. Left ventricular diastolic parameters are  consistent with Grade I diastolic  dysfunction (impaired relaxation). The average left ventricular global  longitudinal strain is -16.0 %. The global longitudinal strain is  abnormal.   2. Right ventricular systolic function is mildly reduced. The right  ventricular size is normal.   3. Left atrial size was moderately dilated.   4. The mitral valve is normal in structure. Trivial mitral valve  regurgitation. No evidence of mitral stenosis.   5. The aortic valve is tricuspid. Aortic valve regurgitation is not  visualized. No aortic stenosis is present.   6. The inferior vena cava is normal in size with greater than 50%  respiratory variability, suggesting right atrial pressure of 3 mmHg.   Assessment and Plan:  1.  Ischemic cardiomyopathy with chronic combined heart failure.  LVEF has improved to the range of 40 to 45% by most recent assessment, also mild RV dysfunction.  He is clinically stable with NYHA class I dyspnea, no evidence of fluid overload.  Doing well on current regimen.  Continue Coreg, [place], Farxiga, and Lasix.  Not on Aldactone with prior history of hyperkalemia.  2.  History of atrial flutter ablation and also paroxysmal atrial fibrillation.  CHA2DS2-VASc score is 3.  Continue Eliquis for stroke prophylaxis, no bleeding problems reported.  He is also on low-dose  amiodarone.  Check TSH, CMET, and CBC.  3.  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  Continue Lipitor and as needed nitroglycerin..  Medication Adjustments/Labs and Tests Ordered: Current medicines are reviewed at length with the patient today.  Concerns regarding medicines are outlined above.   Tests Ordered: Orders Placed This Encounter  Procedures   CBC   Comprehensive Metabolic Panel (CMET)   TSH   EKG 12-Lead    Medication Changes: No orders of the defined types were placed in this encounter.   Disposition:  Follow up  6 months.  Signed, 04-26-2002, MD, [facility] 12/29/2020 8:58 AM    [facility] at [facility] [address] [place], [place], [place] Phone: [phone]; Fax: [phone]

## 2020-12-29 ENCOUNTER — Ambulatory Visit (INDEPENDENT_AMBULATORY_CARE_PROVIDER_SITE_OTHER): Payer: 59 | Admitting: Cardiology

## 2020-12-29 ENCOUNTER — Encounter: Payer: Self-pay | Admitting: Cardiology

## 2020-12-29 VITALS — BP 114/58 | HR 57 | Ht 66.0 in | Wt 153.8 lb

## 2020-12-29 DIAGNOSIS — I4892 Unspecified atrial flutter: Secondary | ICD-10-CM | POA: Diagnosis not present

## 2020-12-29 DIAGNOSIS — I4891 Unspecified atrial fibrillation: Secondary | ICD-10-CM | POA: Diagnosis not present

## 2020-12-29 DIAGNOSIS — I255 Ischemic cardiomyopathy: Secondary | ICD-10-CM

## 2020-12-29 DIAGNOSIS — I25119 Atherosclerotic heart disease of native coronary artery with unspecified angina pectoris: Secondary | ICD-10-CM

## 2020-12-29 NOTE — Patient Instructions (Signed)
Medication Instructions:  Your physician recommends that you continue on your current medications as directed. Please refer to the Current Medication list given to you today.  *If you need a refill on your cardiac medications before your next appointment, please call your pharmacy*   Lab Work: TSH CMET CBC If you have labs (blood work) drawn today and your tests are completely normal, you will receive your results only by: MyChart Message (if you have MyChart) OR A paper copy in the mail If you have any lab test that is abnormal or we need to change your treatment, we will call you to review the results.   Testing/Procedures: None   Follow-Up: At [facility], you and your health needs are our priority.  As part of our continuing mission to provide you with exceptional heart care, we have created designated Provider Care Teams.  These Care Teams include your primary Cardiologist (physician) and Advanced Practice Providers (APPs -  Physician Assistants and Nurse Practitioners) who all work together to provide you with the care you need, when you need it.  We recommend signing up for the patient portal called "MyChart".  Sign up information is provided on this After Visit Summary.  MyChart is used to connect with patients for Virtual Visits (Telemedicine).  Patients are able to view lab/test results, encounter notes, upcoming appointments, etc.  Non-urgent messages can be sent to your provider as well.   To learn more about what you can do with MyChart, go to [email or web address].    Your next appointment:   6 month(s)  The format for your next appointment:   In Person  Provider:   [name], MD   Other Instructions

## 2021-01-05 ENCOUNTER — Other Ambulatory Visit: Payer: Self-pay

## 2021-01-05 ENCOUNTER — Other Ambulatory Visit (HOSPITAL_COMMUNITY)
Admission: RE | Admit: 2021-01-05 | Discharge: 2021-01-05 | Disposition: A | Discharge status: Home / Self Care | Payer: 59 | Source: Ambulatory Visit | Attending: Cardiology | Admitting: Cardiology

## 2021-01-05 DIAGNOSIS — I4892 Unspecified atrial flutter: Secondary | ICD-10-CM | POA: Insufficient documentation

## 2021-01-05 DIAGNOSIS — I4891 Unspecified atrial fibrillation: Secondary | ICD-10-CM | POA: Diagnosis present

## 2021-01-05 LAB — COMPREHENSIVE METABOLIC PANEL
ALT: 25 U/L (ref 0–44)
AST: 20 U/L (ref 15–41)
Albumin: 4.6 g/dL (ref 3.5–5.0)
Alkaline Phosphatase: 101 U/L (ref 38–126)
Anion gap: 6 (ref 5–15)
BUN: 29 mg/dL — ABNORMAL HIGH (ref 8–23)
CO2: 29 mmol/L (ref 22–32)
Calcium: 9.3 mg/dL (ref 8.9–10.3)
Chloride: 102 mmol/L (ref 98–111)
Creatinine, Ser: 1.22 mg/dL (ref 0.61–1.24)
GFR, Estimated: >60 mL/min (ref >60)
Glucose, Bld: 186 mg/dL — ABNORMAL HIGH (ref 70–99)
Potassium: 5.5 mmol/L — ABNORMAL HIGH (ref 3.5–5.1)
Sodium: 137 mmol/L (ref 135–145)
Total Bilirubin: 0.7 mg/dL (ref 0.3–1.2)
Total Protein: 7.5 g/dL (ref 6.5–8.1)

## 2021-01-05 LAB — CBC
HCT: 49.6 % (ref 39.0–52.0)
Hemoglobin: 16.5 g/dL (ref 13.0–17.0)
MCH: 31.1 pg (ref 26.0–34.0)
MCHC: 33.3 g/dL (ref 30.0–36.0)
MCV: 93.6 fL (ref 80.0–100.0)
Platelets: 260 10*3/uL (ref 150–400)
RBC: 5.3 MIL/uL (ref 4.22–5.81)
RDW: 13.2 % (ref 11.5–15.5)
WBC: 6.7 10*3/uL (ref 4.0–10.5)
nRBC: 0 % (ref 0.0–0.2)

## 2021-01-05 LAB — TSH: TSH: 2.187 u[IU]/mL (ref 0.350–4.500)

## 2021-01-18 ENCOUNTER — Other Ambulatory Visit: Payer: Self-pay | Admitting: Cardiology

## 2021-02-10 ENCOUNTER — Other Ambulatory Visit: Payer: Self-pay | Admitting: Cardiology

## 2021-04-11 ENCOUNTER — Other Ambulatory Visit: Payer: Self-pay | Admitting: *Deleted

## 2021-04-11 MED ORDER — CARVEDILOL 3.125 MG PO TABS: 3.1250 mg | ORAL_TABLET | Freq: Two times a day (BID) | ORAL | 3 refills | Status: DC

## 2021-05-14 ENCOUNTER — Other Ambulatory Visit: Payer: Self-pay | Admitting: Cardiology

## 2021-05-14 NOTE — Telephone Encounter (Signed)
Prescription refill request for Eliquis received. Indication: Atrial fib Last office visit: 12/29/20  Ival Bible MD Scr: 1.22 on 01/05/21 Age:  66 Weight:  69.8kg  Based on above findings Eliquis 5mg  twice daily is the appropriate dose.  Refill approved.

## 2021-07-04 NOTE — Progress Notes (Signed)
? ? ?Cardiology Office Note ? ?Date: 07/05/2021  ? ?ID: [name], DOB 26-Jan-1956, MRN [id number] ? ?PCP:  [name], MD  ?Cardiologist:  [name], MD ?Electrophysiologist:  [name], MD  ? ?Chief Complaint  ?Patient presents with  ? Cardiac follow-up  ? ? ?History of Present Illness: ?[name] is a 66 y.o. male last seen in September 2022.  He is here with his wife for a follow-up visit.  He does not describe any angina or nitroglycerin use, currently with NYHA class I dyspnea, no palpitations or syncope. ? ?He has been traveling some with his wife, enjoys going to [employer] and fishing.  Also still enjoying his vintage cars. ? ?I reviewed his medications which are outlined below.  He has done well, no changes in the interim.  Requesting interval lab work from December 2022 per PCP. ? ?We discussed updating his echocardiogram.  Last study in March 2022 revealed LVEF 40 to 45% range. ? ?Past Medical History:  ?Diagnosis Date  ? Atrial flutter (HCC)   ? RFA March 2020 - Dr. [name]  ? Cardiomyopathy (HCC)   ? Coronary atherosclerosis of native coronary artery   ? Multivessel, LVEF 60%  ? Essential hypertension   ? Hyperlipidemia   ? Type 2 diabetes mellitus (HCC)   ? ? ?Past Surgical History:  ?Procedure Laterality Date  ? A-FLUTTER ABLATION N/A 07/17/2018  ? Procedure: A-FLUTTER ABLATION;  Surgeon: [name], MD;  Location: [facility] INVASIVE CV LAB;  Service: Cardiovascular;  Laterality: N/A;  ? CORONARY ARTERY BYPASS GRAFT  09/09  ? LIMA to LAD, SVG to D1/OM1/OM2, SVG to D2, SVG to PDA  ? RIGHT/LEFT HEART CATH AND CORONARY/GRAFT ANGIOGRAPHY N/A 07/17/2018  ? Procedure: RIGHT/LEFT HEART CATH AND CORONARY/GRAFT ANGIOGRAPHY;  Surgeon: Swaziland, Peter M, MD;  Location: [facility] INVASIVE CV LAB;  Service: Cardiovascular;  Laterality: N/A;  ? ? ?Current Outpatient Medications  ?Medication Sig Dispense Refill  ? amiodarone (PACERONE) 200 MG tablet TAKE 1/2 TABLET BY MOUTH DAILY 45 tablet 6  ? apixaban  (ELIQUIS) 5 MG TABS tablet TAKE ONE TABLET BY MOUTH TWICE DAILY 60 tablet 5  ? atorvastatin (LIPITOR) 80 MG tablet TAKE ONE TABLET BY MOUTH EVERY EVENING 30 tablet 6  ? carvedilol (COREG) 3.125 MG tablet Take 1 tablet (3.125 mg total) by mouth 2 (two) times daily. 180 tablet 3  ? ENTRESTO 24-26 MG TAKE ONE TABLET BY MOUTH TWICE DAILY 60 tablet 6  ? FARXIGA 5 MG TABS tablet Take 5 mg by mouth daily.    ? fexofenadine (ALLEGRA) 180 MG tablet Take 180 mg by mouth as needed for allergies or rhinitis.    ? furosemide (LASIX) 20 MG tablet Take 1 tablet (20 mg total) by mouth daily. 30 tablet 3  ? glyBURIDE-metformin (GLUCOVANCE) 5-500 MG per tablet Take 1 tablet by mouth 2 (two) times daily. 1.5 tablets in the am 2 tablets in the pm    ? nitroGLYCERIN (NITROSTAT) 0.4 MG SL tablet DISSOLVE ONE TABLET UNDER TONGUE EVERY 5 MINUTES UP TO 3 DOSES AS NEEDED FOR CHEST PAIN 25 tablet 6  ? Omega-3 Fatty Acids (FISH OIL) 1000 MG CAPS Take 1,000 mg by mouth 2 (two) times daily.    ? pantoprazole (PROTONIX) 40 MG tablet Take 40 mg by mouth daily.    ? ?No current facility-administered medications for this visit.  ? ?Allergies:  Furosemide  ? ?ROS: No orthopnea or PND.  No leg swelling. ? ?Physical Exam: ?VS:  BP 138/80   Pulse 60   Ht 5\' 5"  (1.651 m)   Wt 157 lb 3.2 oz (71.3 kg)   SpO2 97%   BMI 26.16 kg/m? , BMI Body mass index is 26.16 kg/m?. ? ?Wt Readings from Last 3 Encounters:  ?07/05/21 157 lb 3.2 oz (71.3 kg)  ?12/29/20 153 lb 12.8 oz (69.8 kg)  ?06/20/20 151 lb (68.5 kg)  ?  ?General: Patient appears comfortable at rest. ?HEENT: Conjunctiva and lids normal, wearing a mask. ?Neck: Supple, no elevated JVP or carotid bruits, no thyromegaly. ?Lungs: Clear to auscultation, nonlabored breathing at rest. ?Cardiac: Regular rate and rhythm, no S3 or significant systolic murmur. ?Extremities: No pitting edema. ? ?ECG:  An ECG dated 12/29/2020 was personally reviewed today and demonstrated:  Sinus bradycardia with nonspecific T wave  changes. ? ?Recent Labwork: ?01/05/2021: ALT 25; AST 20; BUN 29; Creatinine, Ser 1.22; Hemoglobin 16.5; Platelets 260; Potassium 5.5; Sodium 137; TSH 2.187  ?   ?Component Value Date/Time  ? CHOL 173 07/15/2018 0355  ? TRIG 85 07/15/2018 0355  ? HDL 51 07/15/2018 0355  ? CHOLHDL 3.4 07/15/2018 0355  ? VLDL 17 07/15/2018 0355  ? LDLCALC 105 (H) 07/15/2018 0355  ? ? ?Other Studies Reviewed Today: ? ?Echocardiogram 07/19/2020: ? 1. Left ventricular ejection fraction, by estimation, is 40 to 45%. The  ?left ventricle has mildly decreased function. The left ventricle  ?demonstrates global hypokinesis. Left ventricular diastolic parameters are  ?consistent with Grade I diastolic  ?dysfunction (impaired relaxation). The average left ventricular global  ?longitudinal strain is -16.0 %. The global longitudinal strain is  ?abnormal.  ? 2. Right ventricular systolic function is mildly reduced. The right  ?ventricular size is normal.  ? 3. Left atrial size was moderately dilated.  ? 4. The mitral valve is normal in structure. Trivial mitral valve  ?regurgitation. No evidence of mitral stenosis.  ? 5. The aortic valve is tricuspid. Aortic valve regurgitation is not  ?visualized. No aortic stenosis is present.  ? 6. The inferior vena cava is normal in size with greater than 50%  ?respiratory variability, suggesting right atrial pressure of 3 mmHg.  ? ?Assessment and Plan: ? ?1.  HFrEF with ischemic cardiomyopathy.  LVEF 40 to 45% range by last assessment in March 2022.  We will update an echocardiogram.  Also requesting interval lab work from PCP.  He is clinically stable with NYHA class I symptoms.  Continue Coreg, [place], Farxiga, and Lasix.  He is not on Aldactone with prior history of hyperkalemia. ? ?2.  History of atrial flutter and paroxysmal atrial fibrillation.  Doing well with no active palpitations.  CHA2DS2-VASc score is 3.  He is on Eliquis for stroke prophylaxis, also low-dose amiodarone. ? ?3.  Multivessel CAD  status post CABG with graft disease.  No angina or nitroglycerin use.  Continue Lipitor. ? ?Medication Adjustments/Labs and Tests Ordered: ?Current medicines are reviewed at length with the patient today.  Concerns regarding medicines are outlined above.  ? ?Tests Ordered: ?Orders Placed This Encounter  ?Procedures  ? ECHOCARDIOGRAM COMPLETE  ? ? ?Medication Changes: ?No orders of the defined types were placed in this encounter. ? ? ?Disposition:  Follow up  6 months. ? ?Signed, ?04-26-2002, MD, [facility] ?07/05/2021 9:55 AM    ?[facility] at [facility] ?[address] [place], [place], [place] ?Phone: [phone]; Fax: [phone]  ?

## 2021-07-05 ENCOUNTER — Ambulatory Visit (INDEPENDENT_AMBULATORY_CARE_PROVIDER_SITE_OTHER): Payer: 59

## 2021-07-05 ENCOUNTER — Encounter: Payer: Self-pay | Admitting: Cardiology

## 2021-07-05 ENCOUNTER — Ambulatory Visit: Payer: 59 | Admitting: Cardiology

## 2021-07-05 VITALS — BP 138/80 | HR 60 | Ht 65.0 in | Wt 157.2 lb

## 2021-07-05 DIAGNOSIS — I255 Ischemic cardiomyopathy: Secondary | ICD-10-CM

## 2021-07-05 DIAGNOSIS — I5020 Unspecified systolic (congestive) heart failure: Secondary | ICD-10-CM

## 2021-07-05 DIAGNOSIS — I25119 Atherosclerotic heart disease of native coronary artery with unspecified angina pectoris: Secondary | ICD-10-CM | POA: Diagnosis not present

## 2021-07-05 DIAGNOSIS — I480 Paroxysmal atrial fibrillation: Secondary | ICD-10-CM | POA: Diagnosis not present

## 2021-07-05 LAB — ECHOCARDIOGRAM COMPLETE
AR max vel: 2.62 cm2
AV Area VTI: 2.67 cm2
AV Area mean vel: 2.65 cm2
AV Mean grad: 3 mmHg
AV Peak grad: 6 mmHg
Ao pk vel: 1.22 m/s
Area-P 1/2: 3.81 cm2
Calc EF: 49.9 %
Height: 65 in
S' Lateral: 4.54 cm
Single Plane A2C EF: 50.1 %
Single Plane A4C EF: 49.4 %
Weight: 2515.2 oz

## 2021-07-05 NOTE — Patient Instructions (Signed)

## 2021-08-09 ENCOUNTER — Other Ambulatory Visit: Payer: Self-pay | Admitting: Cardiology

## 2021-09-10 ENCOUNTER — Other Ambulatory Visit: Payer: Self-pay | Admitting: Cardiology

## 2021-11-08 ENCOUNTER — Other Ambulatory Visit: Payer: Self-pay | Admitting: Cardiology

## 2021-11-08 NOTE — Telephone Encounter (Signed)
Prescription refill request for Eliquis received. Indication: Atrial Fib/Flutter Last office visit: 07/05/21  Ival Bible MD Scr: 1.12 on 04/19/21 Age:  66 Weight: 71.3kg  Based on above findings Eliquis 5mg  twice daily is the appropriate dose.  Refill approved.

## 2022-01-07 NOTE — Progress Notes (Unsigned)
Cardiology Office Note  Date: 01/08/2022   ID: [name], DOB 12/23/1955, MRN [id number]  PCP:  [name], MD  Cardiologist:  [name], MD Electrophysiologist:  [name], MD   Chief Complaint  Patient presents with   Cardiac follow-up    History of Present Illness: [name] is a 66 y.o. male last seen in March.  He is here today with his wife for a follow-up visit.  Continues to do very well, no angina or nitroglycerin use, NYHA class I dyspnea.  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  We talked about some of his recent projects.  Follow-up echocardiogram in March of this year revealed improvement in LVEF to the range of 45 to 50%, normal RV contraction, and mild to moderate mitral regurgitation.  I reviewed his medications, he is tolerating current cardiac regimen very well.  No bleeding problems on Eliquis.  He will have lab work with PCP in December.  I personally reviewed his ECG today which shows sinus rhythm with old inferior infarct pattern.  Past Medical History:  Diagnosis Date   Atrial flutter [facility])    RFA March 2020 - Dr. [name]   Cardiomyopathy [facility])    Coronary atherosclerosis of native coronary artery    Multivessel, LVEF 60%   Essential hypertension    Hyperlipidemia    Type 2 diabetes mellitus [facility])     Past Surgical History:  Procedure Laterality Date   A-FLUTTER ABLATION N/A 07/17/2018   Procedure: A-FLUTTER ABLATION;  Surgeon: [name], MD;  Location: [facility] INVASIVE CV LAB;  Service: Cardiovascular;  Laterality: N/A;   CORONARY ARTERY BYPASS GRAFT  09/09   LIMA to LAD, SVG to D1/OM1/OM2, SVG to D2, SVG to PDA   RIGHT/LEFT HEART CATH AND CORONARY/GRAFT ANGIOGRAPHY N/A 07/17/2018   Procedure: RIGHT/LEFT HEART CATH AND CORONARY/GRAFT ANGIOGRAPHY;  Surgeon: Swaziland, Peter M, MD;  Location: [facility] INVASIVE CV LAB;  Service: Cardiovascular;  Laterality: N/A;    Current Outpatient Medications  Medication  Sig Dispense Refill   amiodarone (PACERONE) 200 MG tablet TAKE 1/2 TABLET BY MOUTH DAILY 45 tablet 6   atorvastatin (LIPITOR) 80 MG tablet TAKE ONE TABLET BY MOUTH EVERY EVENING 30 tablet 6   carvedilol (COREG) 3.125 MG tablet Take 1 tablet (3.125 mg total) by mouth 2 (two) times daily. 180 tablet 3   ELIQUIS 5 MG TABS tablet TAKE ONE TABLET BY MOUTH TWICE DAILY 60 tablet 5   ENTRESTO 24-26 MG TAKE ONE TABLET BY MOUTH TWICE DAILY 60 tablet 6   FARXIGA 5 MG TABS tablet Take 5 mg by mouth daily.     fexofenadine (ALLEGRA) 180 MG tablet Take 180 mg by mouth as needed for allergies or rhinitis.     furosemide (LASIX) 20 MG tablet Take 1 tablet (20 mg total) by mouth daily. 30 tablet 3   glyBURIDE-metformin (GLUCOVANCE) 5-500 MG per tablet Take 1 tablet by mouth 2 (two) times daily. 1.5 tablets in the am 2 tablets in the pm     nitroGLYCERIN (NITROSTAT) 0.4 MG SL tablet DISSOLVE ONE TABLET UNDER TONGUE EVERY 5 MINUTES UP TO 3 DOSES AS NEEDED FOR CHEST PAIN 25 tablet 6   Omega-3 Fatty Acids (FISH OIL) 1000 MG CAPS Take 1,000 mg by mouth 2 (two) times daily.     pantoprazole (PROTONIX) 40 MG tablet Take 40 mg by mouth daily.     No current facility-administered medications for this visit.  Allergies:  Furosemide   ROS: No palpitations or syncope, no orthopnea or PND.  Physical Exam: VS:  BP 104/62   Pulse 64   Ht 5\' 5"  (1.651 m)   Wt 151 lb 6.4 oz (68.7 kg)   SpO2 96%   BMI 25.19 kg/m , BMI Body mass index is 25.19 kg/m.  Wt Readings from Last 3 Encounters:  01/08/22 151 lb 6.4 oz (68.7 kg)  07/05/21 157 lb 3.2 oz (71.3 kg)  12/29/20 153 lb 12.8 oz (69.8 kg)    General: Patient appears comfortable at rest. HEENT: Conjunctiva and lids normal. Lungs: Clear to auscultation, nonlabored breathing at rest. Cardiac: Regular rate and rhythm, no S3 or significant systolic murmur. Extremities: No pitting edema.  ECG:  An ECG dated 12/29/2020 was personally reviewed today and demonstrated:   Sinus bradycardia with nonspecific T wave changes.  Recent Labwork:  December 2022: Hemoglobin 15.3, platelets 238, BUN 24, creatinine 1.12, potassium 4.7, AST 25, ALT 42, TSH 1.68, cholesterol 173, triglycerides 112, HDL 40, LDL 113 February 2023: Hemoglobin A1c 7.3%  Other Studies Reviewed Today:  Echocardiogram 07/05/2021:  1. Left ventricular ejection fraction, by estimation, is 45 to 50%. The  left ventricle has mildly decreased function. The left ventricle  demonstrates regional wall motion abnormalities (see scoring  diagram/findings for description). Left ventricular  diastolic parameters are consistent with Grade I diastolic dysfunction  (impaired relaxation). Reduced global longitudinal strain of -13.9%.   2. Right ventricular systolic function is normal. The right ventricular  size is normal. Tricuspid regurgitation signal is inadequate for assessing  PA pressure.   3. Left atrial size was mildly dilated.   4. The mitral valve is grossly normal. Mild to moderate mitral valve  regurgitation.   5. The aortic valve is tricuspid. Aortic valve regurgitation is not  visualized.   6. The inferior vena cava is normal in size with greater than 50%  respiratory variability, suggesting right atrial pressure of 3 mmHg.   Assessment and Plan:  1.  HFmrEF with ischemic cardiomyopathy, LVEF 45 to 50% range by most recent assessment.  Doing very well with NYHA class I symptoms.  Continue Coreg, [place], Farxiga, and Lasix.  He is not on Aldactone due to prior hyperkalemia.  He will have follow-up lab work in December with PCP.  2.  Paroxysmal atrial fibrillation and history of prior atrial flutter.  He is doing well on amiodarone.  Continue Eliquis for stroke prophylaxis with CHA2DS2-VASc score of 3.  Should have TSH and LFTs with lab work in December per PCP.  3.  Mild to moderate mitral regurgitation.  4.  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  Continue Lipitor.  Medication Adjustments/Labs and Tests Ordered: Current medicines are reviewed at length with the patient today.  Concerns regarding medicines are outlined above.   Tests Ordered: Orders Placed This Encounter  Procedures   EKG 12-Lead    Medication Changes: No orders of the defined types were placed in this encounter.   Disposition:  Follow up  6 months.  Signed, Satira Sark, MD, [place] 01/08/2022 12:25 PM    [facility] [place], [place], [place] [address] Phone: [phone]; Fax: [phone]

## 2022-01-08 ENCOUNTER — Encounter: Payer: Self-pay | Admitting: Cardiology

## 2022-01-08 ENCOUNTER — Ambulatory Visit: Payer: 59 | Attending: Cardiology | Admitting: Cardiology

## 2022-01-08 VITALS — BP 104/62 | HR 64 | Ht 65.0 in | Wt 151.4 lb

## 2022-01-08 DIAGNOSIS — I480 Paroxysmal atrial fibrillation: Secondary | ICD-10-CM | POA: Diagnosis not present

## 2022-01-08 DIAGNOSIS — I25119 Atherosclerotic heart disease of native coronary artery with unspecified angina pectoris: Secondary | ICD-10-CM

## 2022-01-08 DIAGNOSIS — I255 Ischemic cardiomyopathy: Secondary | ICD-10-CM | POA: Diagnosis not present

## 2022-01-08 NOTE — Patient Instructions (Signed)

## 2022-02-12 ENCOUNTER — Other Ambulatory Visit: Payer: Self-pay | Admitting: Cardiology

## 2022-03-11 ENCOUNTER — Other Ambulatory Visit: Payer: Self-pay | Admitting: Cardiology

## 2022-03-29 DIAGNOSIS — Z125 Encounter for screening for malignant neoplasm of prostate: Secondary | ICD-10-CM | POA: Diagnosis not present

## 2022-03-29 DIAGNOSIS — R69 Illness, unspecified: Secondary | ICD-10-CM | POA: Diagnosis not present

## 2022-03-29 DIAGNOSIS — E7800 Pure hypercholesterolemia, unspecified: Secondary | ICD-10-CM | POA: Diagnosis not present

## 2022-03-29 DIAGNOSIS — Z79899 Other long term (current) drug therapy: Secondary | ICD-10-CM | POA: Diagnosis not present

## 2022-03-29 DIAGNOSIS — Z0000 Encounter for general adult medical examination without abnormal findings: Secondary | ICD-10-CM | POA: Diagnosis not present

## 2022-04-10 ENCOUNTER — Other Ambulatory Visit: Payer: Self-pay | Admitting: Cardiology

## 2022-04-19 ENCOUNTER — Encounter: Payer: Self-pay | Admitting: Cardiology

## 2022-04-24 ENCOUNTER — Telehealth: Payer: Self-pay

## 2022-04-24 ENCOUNTER — Telehealth: Payer: Self-pay | Admitting: Cardiology

## 2022-04-24 DIAGNOSIS — E782 Mixed hyperlipidemia: Secondary | ICD-10-CM

## 2022-04-24 NOTE — Telephone Encounter (Signed)
Satira Sark, MD  Merlene Laughter, RN Results reviewed.  LDL is 129.  If he has been consistently on Lipitor 80 mg daily, we should probably make a virtual lipid clinic referral to discuss adjunctive therapy in an attempt to get his LDL much closer to goal with known CAD.     Attempt to reach at 0905, got VM,left message to return call

## 2022-04-24 NOTE — Telephone Encounter (Signed)
-----   Message from Satira Sark, MD sent at 04/19/2022  4:33 PM EST ----- Results reviewed.  LDL is 129.  If he has been consistently on Lipitor 80 mg daily, we should probably make a virtual lipid clinic referral to discuss adjunctive therapy in an attempt to get his LDL much closer to goal with known CAD.

## 2022-04-24 NOTE — Telephone Encounter (Signed)
Follow Up:    Wife [place] returning a call from yesterday. She did not know who called and she thinks they were calling with results.

## 2022-04-24 NOTE — Telephone Encounter (Signed)
Results discussed with patient.he confirms he is taking Lipitor 80 mg qd. He agrees to referral for VV with Lipid clinic.

## 2022-05-10 ENCOUNTER — Other Ambulatory Visit: Payer: Self-pay | Admitting: Cardiology

## 2022-05-10 NOTE — Telephone Encounter (Signed)
Prescription refill request for Eliquis received. Indication: afib  Last office visit: [place], 01/08/2022 Scr: 03/29/2022, 1.09 Age: 67 yo  Weight: 68.7kg   Refill sent.

## 2022-05-27 ENCOUNTER — Other Ambulatory Visit: Payer: Self-pay | Admitting: Cardiology

## 2022-06-14 ENCOUNTER — Ambulatory Visit: Payer: 59 | Attending: Cardiovascular Disease | Admitting: Student

## 2022-06-14 DIAGNOSIS — E7849 Other hyperlipidemia: Secondary | ICD-10-CM | POA: Diagnosis not present

## 2022-06-14 NOTE — Progress Notes (Signed)
Patient ID: [name]                 DOB: June 09, 1955                    MRN: [id number]      HPI: [name] is a 67 y.o. male patient referred to lipid clinic by Dr. Domenic Polite. PMH is significant for Diabetes, HTN, hx of MI, CAD, atrial fibrillation,CHF  Patient presented with his wife today. He has been taking his Lipitor every day and tolerating it well. He does not go for regular exercise but he is very active around the house. He is involved in heavy work at his work place plus in spring/summer months he does lot of yard work. He is turning 32 in March so his insurance will be changing so he wants to wait till mid March to start new medication for cholesterol.   Diet:   Eats home cooked healthy small meals, likes lots of greens does not add lot dressings to his greens, meat- grilled/baked chicken  Exercise: busy around the house, involved in car building   Family History:  Cancer diabetes and HF in both sides of the family   Social History:  Alcohol: none Smoking: never Recreational drug: never  Labs: From PCP Last lipid lab : TC: 193, TG: 95, HDL: 47, LDLc: 129 Lipid Panel     Component Value Date/Time   CHOL 173 07/15/2018 0355   TRIG 85 07/15/2018 0355   HDL 51 07/15/2018 0355   CHOLHDL 3.4 07/15/2018 0355   VLDL 17 07/15/2018 0355   LDLCALC 105 (H) 07/15/2018 0355    Past Medical History:  Diagnosis Date   Atrial flutter [facility])    RFA March 2020 - Dr. Lovena Le   Cardiomyopathy [facility])    Coronary atherosclerosis of native coronary artery    Multivessel, LVEF 60%   Essential hypertension    Hyperlipidemia    Type 2 diabetes mellitus ([place])     Current Outpatient Medications on File Prior to Visit  Medication Sig Dispense Refill   amiodarone (PACERONE) 200 MG tablet TAKE 1/2 TABLET BY MOUTH DAILY 45 tablet 3   atorvastatin (LIPITOR) 80 MG tablet TAKE ONE TABLET BY MOUTH EVERY EVENING 30 tablet 6   carvedilol (COREG) 3.125 MG tablet TAKE ONE TABLET BY MOUTH  TWICE DAILY 180 tablet 3   ELIQUIS 5 MG TABS tablet TAKE ONE TABLET BY MOUTH TWICE DAILY 60 tablet 5   ENTRESTO 24-26 MG TAKE ONE TABLET BY MOUTH TWICE DAILY 60 tablet 6   FARXIGA 5 MG TABS tablet Take 5 mg by mouth daily.     fexofenadine (ALLEGRA) 180 MG tablet Take 180 mg by mouth as needed for allergies or rhinitis.     furosemide (LASIX) 20 MG tablet Take 1 tablet (20 mg total) by mouth daily. 30 tablet 3   glyBURIDE-metformin (GLUCOVANCE) 5-500 MG per tablet Take 1 tablet by mouth 2 (two) times daily. 1.5 tablets in the am 2 tablets in the pm     nitroGLYCERIN (NITROSTAT) 0.4 MG SL tablet DISSOLVE ONE TABLET UNDER TONGUE EVERY 5 MINUTES UP TO 3 DOSES AS NEEDED FOR CHEST PAIN 25 tablet 6   Omega-3 Fatty Acids (FISH OIL) 1000 MG CAPS Take 1,000 mg by mouth 2 (two) times daily.     pantoprazole (PROTONIX) 40 MG tablet Take 40 mg by mouth daily.     No current facility-administered medications on file prior to visit.  Allergies  Allergen Reactions   Furosemide Nausea Only    Assessment/Plan:  1. Hyperlipidemia -  Problem  Hyperlipidemia   Current Medications: Lipitor 80 mg  Intolerances: none  Risk Factors: CAD, hx of MI, T2DM, CHF, HTN LDL goal: <55 mg/dl  Last lipid lab : TC: 193, TG: 95, HDL: 47, LDLc: 129     Hyperlipidemia Assessment:  LDL goal: < 55 mg/dl last LDLc 129 mg/dl  Tolerates high intensity statins well without any side effects  Reviewed options for lowering LDL cholesterol, including ezetimibe, PCSK-9 inhibitors, bempedoic acid and inclisiran Discussed mechanisms of action, dosing, side effects and potential decreases in LDL cholesterol Patient will be getting new insurance mid March would like to wait to start PCSK9i agent till then  Plan: Continue taking current medications Lipitor 80 mg daily Will wait till new insurance to be activated ,Will apply for PA for PCSK9i; will inform patient upon approval  Lipid lab due in 2-3 months after starting  PCSK9i    Thank you,  Cammy Copa, Pharm.D [facility] of [facility] [address], [place], [place] [address]  Phone: [phone]; Fax: [phone]

## 2022-06-14 NOTE — Patient Instructions (Signed)
Your Results:             Your most recent labs Goal  Total Cholesterol 193 < 200  Triglycerides 95 < 150  HDL (happy/good cholesterol) 47 > 40  LDL (lousy/bad cholesterol 129 < 55   Medication changes: We will start the process to get PCSK9i (Repatha or Praluent)  covered by your insurance.  Once the prior authorization is complete, we will call you to let you know and confirm pharmacy information.      Praluent is a cholesterol medication that improved your body's ability to get rid of "bad cholesterol" known as LDL. It can lower your LDL up to 60%. It is an injection that is given under the skin every 2 weeks. The most common side effects of Praluent include runny nose, symptoms of the common cold, rarely flu or flu-like symptoms, back/muscle pain in about 3-4% of the patients, and redness, pain, or bruising at the injection site.    Repatha is a cholesterol medication that improved your body's ability to get rid of "bad cholesterol" known as LDL. It can lower your LDL up to 60%! It is an injection that is given under the skin every 2 weeks. The most common side effects of Repatha include runny nose, symptoms of the common cold, rarely flu or flu-like symptoms, back/muscle pain in about 3-4% of the patients, and redness, pain, or bruising at the injection site.   Lab orders: We want to repeat labs after 2-3 months.  We will send you a lab order to remind you once we get closer to that time.

## 2022-06-14 NOTE — Progress Notes (Signed)
Patient ID: [name]                 DOB: Oct 20, 1955                    MRN: [id number]      HPI: [name] is a 67 y.o. male patient referred to lipid clinic by Dr. Domenic Church. PMH is significant for Diabetes, HTN, hx of MI, CAD, atrial fibriliation,CHF   Reviewed options for lowering LDL cholesterol, including ezetimibe, PCSK-9 inhibitors, bempedoic acid and inclisiran.  Discussed mechanisms of action, dosing, side effects and potential decreases in LDL cholesterol.  Also reviewed cost information and potential options for patient assistance.  Current Medications: Lipitor 80 mg  Intolerances: none ?? Risk Factors: CAD, hx of MI, T2DM, CHF, HTN LDL goal: <55 mg/dl  Last lipid lab : TC: 193, TG: 95, HDL: 47, LDLc: 129.  Diet:   Exercise:   Family History:   Social History:   Labs: Lipid Panel     Component Value Date/Time   CHOL 173 07/15/2018 0355   TRIG 85 07/15/2018 0355   HDL 51 07/15/2018 0355   CHOLHDL 3.4 07/15/2018 0355   VLDL 17 07/15/2018 0355   LDLCALC 105 (H) 07/15/2018 0355    Past Medical History:  Diagnosis Date   Atrial flutter [facility])    RFA March 2020 - Dr. Lovena Le   Cardiomyopathy [facility])    Coronary atherosclerosis of native coronary artery    Multivessel, LVEF 60%   Essential hypertension    Hyperlipidemia    Type 2 diabetes mellitus ([place])     Current Outpatient Medications on File Prior to Visit  Medication Sig Dispense Refill   amiodarone (PACERONE) 200 MG tablet TAKE 1/2 TABLET BY MOUTH DAILY 45 tablet 3   atorvastatin (LIPITOR) 80 MG tablet TAKE ONE TABLET BY MOUTH EVERY EVENING 30 tablet 6   carvedilol (COREG) 3.125 MG tablet TAKE ONE TABLET BY MOUTH TWICE DAILY 180 tablet 3   ELIQUIS 5 MG TABS tablet TAKE ONE TABLET BY MOUTH TWICE DAILY 60 tablet 5   ENTRESTO 24-26 MG TAKE ONE TABLET BY MOUTH TWICE DAILY 60 tablet 6   FARXIGA 5 MG TABS tablet Take 5 mg by mouth daily.     fexofenadine (ALLEGRA) 180 MG tablet Take 180 mg by mouth as  needed for allergies or rhinitis.     furosemide (LASIX) 20 MG tablet Take 1 tablet (20 mg total) by mouth daily. 30 tablet 3   glyBURIDE-metformin (GLUCOVANCE) 5-500 MG per tablet Take 1 tablet by mouth 2 (two) times daily. 1.5 tablets in the am 2 tablets in the pm     nitroGLYCERIN (NITROSTAT) 0.4 MG SL tablet DISSOLVE ONE TABLET UNDER TONGUE EVERY 5 MINUTES UP TO 3 DOSES AS NEEDED FOR CHEST PAIN 25 tablet 6   Omega-3 Fatty Acids (FISH OIL) 1000 MG CAPS Take 1,000 mg by mouth 2 (two) times daily.     pantoprazole (PROTONIX) 40 MG tablet Take 40 mg by mouth daily.     No current facility-administered medications on file prior to visit.    Allergies  Allergen Reactions   Furosemide Nausea Only    Assessment/Plan:  1. Hyperlipidemia -  No problems updated. No problem-specific Assessment & Plan notes found for this encounter.    Thank you,  Cammy Copa, Pharm.D  [facility] [address], [place], [place] [address]  Phone: [phone]; Fax: [phone]    

## 2022-06-14 NOTE — Assessment & Plan Note (Signed)
Assessment:  LDL goal: < 55 mg/dl last LDLc 129 mg/dl  Tolerates high intensity statins well without any side effects  Reviewed options for lowering LDL cholesterol, including ezetimibe, PCSK-9 inhibitors, bempedoic acid and inclisiran Discussed mechanisms of action, dosing, side effects and potential decreases in LDL cholesterol Patient will be getting new insurance mid March would like to wait to start PCSK9i agent till then  Plan: Continue taking current medications Lipitor 80 mg daily Will wait till new insurance to be activated ,Will apply for PA for PCSK9i; will inform patient upon approval  Lipid lab due in 2-3 months after starting [facility]

## 2022-07-05 ENCOUNTER — Telehealth: Payer: Self-pay | Admitting: Pharmacist

## 2022-07-05 NOTE — Telephone Encounter (Signed)
Need updated insurance information 

## 2022-07-11 ENCOUNTER — Ambulatory Visit: Payer: HMO | Attending: Cardiology | Admitting: Cardiology

## 2022-07-11 ENCOUNTER — Encounter: Payer: Self-pay | Admitting: Cardiology

## 2022-07-11 VITALS — BP 120/66 | HR 63 | Ht 65.0 in | Wt 151.8 lb

## 2022-07-11 DIAGNOSIS — I480 Paroxysmal atrial fibrillation: Secondary | ICD-10-CM | POA: Diagnosis not present

## 2022-07-11 DIAGNOSIS — I255 Ischemic cardiomyopathy: Secondary | ICD-10-CM

## 2022-07-11 DIAGNOSIS — I25119 Atherosclerotic heart disease of native coronary artery with unspecified angina pectoris: Secondary | ICD-10-CM

## 2022-07-11 NOTE — Progress Notes (Signed)
Cardiology Office Note  Date: 07/11/2022   ID: Hermes, Ballow Oct 12, 1955, MRN [id number]  History of Present Illness: [name] is a 67 y.o. male last seen in September 2023.  He is here today with his wife for follow-up visit.  Continues to do well, NYHA class I-II dyspnea, no angina, no palpitations or syncope.  His weight has been stable and he reports no leg edema.  He was seen in the lipid clinic in February, I reviewed the note.  Plan was to continue Lipitor and add PCSK9 inhibitor.  Last LDL 129 in December 2023.  He is waiting to get his lipids checked by PCP coming up and has not yet decided about starting PCSK9 inhibitor.  We talked about this some more today and the rationale for more aggressive lipid control.  I reviewed the remainder of his medications which are otherwise stable from a cardiac perspective.  He reports no intolerances, no nitroglycerin use.  We also plan on getting an updated echocardiogram.  Physical Exam: VS:  BP 120/66   Pulse 63   Ht 5\' 5"  (1.651 m)   Wt 151 lb 12.8 oz (68.9 kg)   SpO2 98%   BMI 25.26 kg/m , BMI Body mass index is 25.26 kg/m.  Wt Readings from Last 3 Encounters:  07/11/22 151 lb 12.8 oz (68.9 kg)  01/08/22 151 lb 6.4 oz (68.7 kg)  07/05/21 157 lb 3.2 oz (71.3 kg)    General: Patient appears comfortable at rest. HEENT: Conjunctiva and lids normal. Neck: Supple, no elevated JVP or carotid bruits. Lungs: Clear to auscultation, nonlabored breathing at rest. Cardiac: Regular rate and rhythm, no S3 or significant systolic murmur. Extremities: No pitting edema.  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  Labwork:  December 2023: Hemoglobin 15.6, platelets 275, BUN 21, creatinine 1.09, potassium 4.6, AST 18, ALT 25, cholesterol 193, triglycerides 95, HDL 47, LDL 129, TSH 1.77  Other Studies Reviewed Today:  Echocardiogram 07/05/2021:  1.  Left ventricular ejection fraction, by estimation, is 45 to 50%. The  left ventricle has mildly decreased function. The left ventricle  demonstrates regional wall motion abnormalities (see scoring  diagram/findings for description). Left ventricular  diastolic parameters are consistent with Grade I diastolic dysfunction  (impaired relaxation). Reduced global longitudinal strain of -13.9%.   2. Right ventricular systolic function is normal. The right ventricular  size is normal. Tricuspid regurgitation signal is inadequate for assessing  PA pressure.   3. Left atrial size was mildly dilated.   4. The mitral valve is grossly normal. Mild to moderate mitral valve  regurgitation.   5. The aortic valve is tricuspid. Aortic valve regurgitation is not  visualized.   6. The inferior vena cava is normal in size with greater than 50%  respiratory variability, suggesting right atrial pressure of 3 mmHg.   Assessment and Plan:  1.  Multivessel CAD status post CABG in 2009 including LIMA to LAD, SVG to D1/OM1/OM 2, SVG to D2, and SVG to PDA.  Cardiac catheterization in 2020 revealed patent LIMA to LAD with occlusion of all vein grafts.  There was collateral filling of the first and second diagonal vessels from the LAD via the LIMA and also left to right collaterals to the PDA.  He was managed medically.  Stable at this time without active angina.  Plan to continue Lipitor and as needed nitroglycerin.  2.  HFmrEF with ischemic cardiomyopathy, LVEF 45 to 50% by echocardiogram in March 2023.  Doing well with no evidence of fluid overload and NYHA class I-II dyspnea.  Continue Coreg, [place], Farxiga, and Lasix.  Echocardiogram will be updated.  3.  Mitral regurgitation, mild to moderate by echocardiogram in March 2023.  4.  Paroxysmal atrial fibrillation with previous history of typical atrial flutter ablation in 2020.  CHA2DS2-VASc score is 3.  He is on Eliquis for stroke prophylaxis.  Continues on  amiodarone for rhythm suppression.  He reports no significant palpitations or bleeding problems.  I reviewed his most recent lab work, LFTs and TSH normal.  5.  Mixed hyperlipidemia on high-dose Lipitor and plan to add PCSK9 inhibitor, I reviewed the recent lipid clinic note.  His LDL was 129 in December 2023.  Disposition:  Follow up  6 months.  Signed, Satira Sark, M.D., F.A.C.C.

## 2022-07-11 NOTE — Patient Instructions (Addendum)
Medication Instructions:  Your physician recommends that you continue on your current medications as directed. Please refer to the Current Medication list given to you today.  Labwork: none  Testing/Procedures: Your physician has requested that you have an echocardiogram. Echocardiography is a painless test that uses sound waves to create images of your heart. It provides your doctor with information about the size and shape of your heart and how well your heart's chambers and valves are working. This procedure takes approximately one hour. There are no restrictions for this procedure. Please do NOT wear cologne, perfume, aftershave, or lotions (deodorant is allowed). Please arrive 15 minutes prior to your appointment time.  Follow-Up: Your physician recommends that you schedule a follow-up appointment in: 6 months  Any Other Special Instructions Will Be Listed Below (If Applicable).  If you need a refill on your cardiac medications before your next appointment, please call your pharmacy. 

## 2022-07-17 DIAGNOSIS — E7800 Pure hypercholesterolemia, unspecified: Secondary | ICD-10-CM | POA: Diagnosis not present

## 2022-07-17 DIAGNOSIS — Z7189 Other specified counseling: Secondary | ICD-10-CM | POA: Diagnosis not present

## 2022-07-17 DIAGNOSIS — Z6824 Body mass index (BMI) 24.0-24.9, adult: Secondary | ICD-10-CM | POA: Diagnosis not present

## 2022-07-17 DIAGNOSIS — Z299 Encounter for prophylactic measures, unspecified: Secondary | ICD-10-CM | POA: Diagnosis not present

## 2022-07-17 DIAGNOSIS — Z1339 Encounter for screening examination for other mental health and behavioral disorders: Secondary | ICD-10-CM | POA: Diagnosis not present

## 2022-07-17 DIAGNOSIS — I10 Essential (primary) hypertension: Secondary | ICD-10-CM | POA: Diagnosis not present

## 2022-07-17 DIAGNOSIS — F339 Major depressive disorder, recurrent, unspecified: Secondary | ICD-10-CM | POA: Diagnosis not present

## 2022-07-17 DIAGNOSIS — Z0000 Encounter for general adult medical examination without abnormal findings: Secondary | ICD-10-CM | POA: Diagnosis not present

## 2022-07-17 DIAGNOSIS — R5383 Other fatigue: Secondary | ICD-10-CM | POA: Diagnosis not present

## 2022-07-17 DIAGNOSIS — Z1331 Encounter for screening for depression: Secondary | ICD-10-CM | POA: Diagnosis not present

## 2022-07-30 ENCOUNTER — Telehealth: Payer: Self-pay

## 2022-07-30 ENCOUNTER — Other Ambulatory Visit (HOSPITAL_COMMUNITY): Payer: Self-pay

## 2022-07-30 DIAGNOSIS — E782 Mixed hyperlipidemia: Secondary | ICD-10-CM

## 2022-07-30 NOTE — Telephone Encounter (Signed)
Pharmacy Patient Advocate Encounter   Received notification from [employer] that prior authorization for REPATHA is needed.    PA submitted on 07/30/22  Status is pending  Haze Rushing, CPhT Pharmacy Patient Advocate Specialist Direct Number: [phone] Fax: [phone]

## 2022-07-31 NOTE — Telephone Encounter (Signed)
Pharmacy Patient Advocate Encounter  Prior Authorization for REPATHA has been approved.    Effective dates: 07/30/22 through 01/29/23  Haze Rushing, CPhT Pharmacy Patient Advocate Specialist Direct Number: [phone] Fax: [phone]

## 2022-08-02 MED ORDER — REPATHA SURECLICK 140 MG/ML ~~LOC~~ SOAJ: 140.0000 mg | SUBCUTANEOUS | 3 refills | Status: DC

## 2022-08-02 NOTE — Telephone Encounter (Signed)
N/A LVM and sent MyChart MSG for the same.

## 2022-08-02 NOTE — Addendum Note (Signed)
Addended by: Tylene Fantasia on: 08/02/2022 02:39 PM   Modules accepted: Orders

## 2022-08-07 ENCOUNTER — Other Ambulatory Visit: Payer: 59

## 2022-08-26 ENCOUNTER — Ambulatory Visit: Payer: HMO | Attending: Cardiology

## 2022-08-26 DIAGNOSIS — I25119 Atherosclerotic heart disease of native coronary artery with unspecified angina pectoris: Secondary | ICD-10-CM | POA: Diagnosis not present

## 2022-08-26 DIAGNOSIS — I255 Ischemic cardiomyopathy: Secondary | ICD-10-CM

## 2022-08-26 LAB — ECHOCARDIOGRAM COMPLETE
AR max vel: 2.85 cm2
AV Area VTI: 2.86 cm2
AV Area mean vel: 2.7 cm2
AV Mean grad: 2.5 mmHg
AV Peak grad: 4.3 mmHg
Ao pk vel: 1.04 m/s
Area-P 1/2: 2.55 cm2
Calc EF: 37.1 %
MV M vel: 3.56 m/s
MV Peak grad: 50.7 mmHg
S' Lateral: 4.7 cm
Single Plane A2C EF: 38.4 %
Single Plane A4C EF: 39 %

## 2022-09-19 DIAGNOSIS — I5022 Chronic systolic (congestive) heart failure: Secondary | ICD-10-CM | POA: Diagnosis not present

## 2022-09-19 DIAGNOSIS — Z299 Encounter for prophylactic measures, unspecified: Secondary | ICD-10-CM | POA: Diagnosis not present

## 2022-09-19 DIAGNOSIS — E1165 Type 2 diabetes mellitus with hyperglycemia: Secondary | ICD-10-CM | POA: Diagnosis not present

## 2022-09-19 DIAGNOSIS — I10 Essential (primary) hypertension: Secondary | ICD-10-CM | POA: Diagnosis not present

## 2022-10-09 NOTE — Telephone Encounter (Signed)
Patient has not read the previous Mychart msg about Repatha approval and follow up lab. Prescription fill hx does not show that Repatha has been dispensed. Call to get more information. N/A LVM

## 2022-11-11 ENCOUNTER — Other Ambulatory Visit: Payer: Self-pay | Admitting: Cardiology

## 2022-12-18 ENCOUNTER — Telehealth: Payer: Self-pay | Admitting: Cardiology

## 2022-12-18 MED ORDER — ENTRESTO 24-26 MG PO TABS: 1.0000 | ORAL_TABLET | Freq: Two times a day (BID) | ORAL | 6 refills | Status: DC

## 2022-12-18 NOTE — Telephone Encounter (Signed)
*  STAT* If patient is at the pharmacy, call can be transferred to refill team.   1. Which medications need to be refilled? (please list name of each medication and dose if known) ELIQUIS 5 MG TABS tablet   ENTRESTO 24-26 MG   2. Which pharmacy/location (including street and city if local pharmacy) is medication to be sent to?  [facility] - [place], [place] - [address]    3. Do they need a 30 day or 90 day supply? 90

## 2022-12-18 NOTE — Telephone Encounter (Signed)
Entresto filled note sent to [facility] for eliquis

## 2022-12-19 MED ORDER — APIXABAN 5 MG PO TABS: 5.0000 mg | ORAL_TABLET | Freq: Two times a day (BID) | ORAL | 5 refills | Status: DC

## 2022-12-19 NOTE — Telephone Encounter (Signed)
Prescription refill request for Eliquis received.  Indication: afib  Last office visit: [place], 07/11/2022 Scr: 1.09, 03/29/2022 Age: 67 yo  Weight: 68.9 kg   Refill sent.

## 2022-12-19 NOTE — Addendum Note (Signed)
Addended by: Cory Roughen on: 12/19/2022 09:17 AM   Modules accepted: Orders

## 2022-12-27 DIAGNOSIS — I152 Hypertension secondary to endocrine disorders: Secondary | ICD-10-CM | POA: Diagnosis not present

## 2022-12-27 DIAGNOSIS — Z299 Encounter for prophylactic measures, unspecified: Secondary | ICD-10-CM | POA: Diagnosis not present

## 2022-12-27 DIAGNOSIS — I10 Essential (primary) hypertension: Secondary | ICD-10-CM | POA: Diagnosis not present

## 2022-12-27 DIAGNOSIS — D6869 Other thrombophilia: Secondary | ICD-10-CM | POA: Diagnosis not present

## 2022-12-27 DIAGNOSIS — E1159 Type 2 diabetes mellitus with other circulatory complications: Secondary | ICD-10-CM | POA: Diagnosis not present

## 2022-12-27 DIAGNOSIS — I5022 Chronic systolic (congestive) heart failure: Secondary | ICD-10-CM | POA: Diagnosis not present

## 2023-01-17 ENCOUNTER — Ambulatory Visit: Payer: HMO | Attending: Cardiology | Admitting: Cardiology

## 2023-01-17 ENCOUNTER — Encounter: Payer: Self-pay | Admitting: Cardiology

## 2023-01-17 VITALS — BP 110/78 | HR 55 | Ht 65.0 in | Wt 147.2 lb

## 2023-01-17 DIAGNOSIS — I480 Paroxysmal atrial fibrillation: Secondary | ICD-10-CM

## 2023-01-17 DIAGNOSIS — I5022 Chronic systolic (congestive) heart failure: Secondary | ICD-10-CM

## 2023-01-17 DIAGNOSIS — E782 Mixed hyperlipidemia: Secondary | ICD-10-CM

## 2023-01-17 DIAGNOSIS — I25119 Atherosclerotic heart disease of native coronary artery with unspecified angina pectoris: Secondary | ICD-10-CM

## 2023-01-17 NOTE — Patient Instructions (Addendum)

## 2023-01-17 NOTE — Progress Notes (Signed)
Cardiology Office Note  Date: 01/17/2023   ID: [name], DOB Jun 22, 1955, MRN [id number]  History of Present Illness: [name] is a 67 y.o. male last seen in March.  He is here with his wife for follow-up visit.  Reports no major change since last encounter.  NYHA class I-II dyspnea, no fluid retention or weight gain, no exertional chest pain, no palpitations or syncope.  I reviewed his medications.  He did not get on Repatha due to high cost but has continued on high-dose Lipitor and his most recent LDL came down to 98 from the 150s.  He has also been focused on diet.  Remainder of his cardiac regimen includes Eliquis, amiodarone, Coreg, Farxiga, Lasix, Entresto, and as needed nitroglycerin.  I reviewed his lab work from March.  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  He had a follow-up echocardiogram in May which revealed LVEF 40 to 45%.  Physical Exam: VS:  BP 110/78   Pulse (!) 55   Ht 5\' 5"  (1.651 m)   Wt 147 lb 3.2 oz (66.8 kg)   SpO2 98%   BMI 24.50 kg/m , BMI Body mass index is 24.5 kg/m.  Wt Readings from Last 3 Encounters:  01/17/23 147 lb 3.2 oz (66.8 kg)  07/11/22 151 lb 12.8 oz (68.9 kg)  01/08/22 151 lb 6.4 oz (68.7 kg)    General: Patient appears comfortable at rest. HEENT: Conjunctiva and lids normal. Neck: Supple, no elevated JVP or carotid bruits. Lungs: Clear to auscultation, nonlabored breathing at rest. Cardiac: Regular rate and rhythm, no S3 or significant systolic murmur. Extremities: No pitting edema.  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  Labwork:  March 2024: Hemoglobin 15.9, platelets 289, BUN 25, creatinine 1.11, potassium 5.3, AST 17, ALT 26, cholesterol 160, triglycerides 108, HDL 42, LDL 98, TSH 2.41  Other Studies Reviewed Today:  Echocardiogram 08/26/2022:  1. Left ventricular ejection fraction, by estimation,  is 40 to 45%. The  left ventricle has mildly decreased function. The left ventricle  demonstrates regional wall motion abnormalities (see scoring  diagram/findings for description). Left ventricular  diastolic parameters were normal. The average left ventricular global  longitudinal strain is -16.7 %. The global longitudinal strain is  abnormal.   2. Right ventricular systolic function is normal. The right ventricular  size is normal. Tricuspid regurgitation signal is inadequate for assessing  PA pressure.   3. Left atrial size was mildly dilated.   4. The mitral valve is normal in structure. Trivial mitral valve  regurgitation. No evidence of mitral stenosis.   5. The aortic valve is tricuspid. There is mild calcification of the  aortic valve. Aortic valve regurgitation is not visualized. No aortic  stenosis is present.   6. Aortic dilatation noted. There is borderline dilatation of the aortic  root, measuring 38 mm. There is borderline dilatation of the ascending  aorta, measuring 38 mm.   7. The inferior vena cava is normal in size with greater than 50%  respiratory variability, suggesting right atrial pressure of 3 mmHg.   Assessment and Plan:  1.  Multivessel CAD status post CABG in 2009 including LIMA to LAD, SVG to D1/OM1/OM 2, SVG to D2, and SVG to PDA.  Cardiac catheterization in 2020 revealed patent LIMA to LAD with occlusion of all vein grafts.  There was collateral filling of the first and second diagonal  vessels from the LAD via the LIMA and also left to right collaterals to the PDA.  He was managed medically.  He continues to do well without angina.  Not on aspirin given use of Eliquis.  Plan to continue Lipitor and as needed nitroglycerin.   2.  HFmrEF with ischemic cardiomyopathy, LVEF 40 to 45% by echocardiogram in May.  RV contraction normal.  Clinically stable with NYHA class I-II dyspnea, no fluid retention.  Continue Coreg, [place], Entresto, and Lasix.   3.  Mitral  regurgitation, trivial by follow-up echocardiogram in May.   4.  Paroxysmal atrial fibrillation with previous history of typical atrial flutter ablation in 2020.  CHA2DS2-VASc score is 3.  No palpitations.  He remains on amiodarone for rhythm suppression and Eliquis for stroke prophylaxis.  No spontaneous bleeding problems reported.   5.  Mixed hyperlipidemia on high-dose Lipitor.  He did not start Repatha due to high cost.  Fortunately, LDL has improved significantly, most recently 98 down from the 150s.  For now continue to work on diet and exercise, he will have this rechecked later in the year.  Disposition:  Follow up  6 months.  Signed, Jonelle Sidle, M.D., F.A.C.C. [facility]

## 2023-03-12 ENCOUNTER — Other Ambulatory Visit: Payer: Self-pay | Admitting: Cardiology

## 2023-03-12 MED ORDER — AMIODARONE HCL 200 MG PO TABS: 100.0000 mg | ORAL_TABLET | Freq: Every day | ORAL | 3 refills | Status: DC

## 2023-04-10 DIAGNOSIS — I5022 Chronic systolic (congestive) heart failure: Secondary | ICD-10-CM | POA: Diagnosis not present

## 2023-04-10 DIAGNOSIS — E1159 Type 2 diabetes mellitus with other circulatory complications: Secondary | ICD-10-CM | POA: Diagnosis not present

## 2023-04-10 DIAGNOSIS — Z299 Encounter for prophylactic measures, unspecified: Secondary | ICD-10-CM | POA: Diagnosis not present

## 2023-04-10 DIAGNOSIS — I10 Essential (primary) hypertension: Secondary | ICD-10-CM | POA: Diagnosis not present

## 2023-04-10 DIAGNOSIS — E11319 Type 2 diabetes mellitus with unspecified diabetic retinopathy without macular edema: Secondary | ICD-10-CM | POA: Diagnosis not present

## 2023-04-10 DIAGNOSIS — I152 Hypertension secondary to endocrine disorders: Secondary | ICD-10-CM | POA: Diagnosis not present

## 2023-05-27 ENCOUNTER — Other Ambulatory Visit: Payer: Self-pay | Admitting: Cardiology

## 2023-05-27 NOTE — Telephone Encounter (Signed)
Prescription refill request for Eliquis received. Indication:afib Last office visit:9/24 Scr:1.11  3/24 Age: 68 Weight:66.8  kg  Prescription refilled

## 2023-05-29 ENCOUNTER — Other Ambulatory Visit: Payer: Self-pay | Admitting: Cardiology

## 2023-06-03 ENCOUNTER — Other Ambulatory Visit: Payer: Self-pay | Admitting: Cardiology

## 2023-06-03 MED ORDER — ATORVASTATIN CALCIUM 80 MG PO TABS: 80.0000 mg | ORAL_TABLET | Freq: Every evening | ORAL | 6 refills | Status: DC

## 2023-07-08 ENCOUNTER — Other Ambulatory Visit: Payer: Self-pay | Admitting: Cardiology

## 2023-07-21 ENCOUNTER — Ambulatory Visit: Payer: HMO | Attending: Cardiology | Admitting: Cardiology

## 2023-07-21 ENCOUNTER — Encounter: Payer: Self-pay | Admitting: Cardiology

## 2023-07-21 VITALS — BP 120/60 | HR 61 | Ht 65.0 in | Wt 149.6 lb

## 2023-07-21 DIAGNOSIS — I5022 Chronic systolic (congestive) heart failure: Secondary | ICD-10-CM | POA: Diagnosis not present

## 2023-07-21 DIAGNOSIS — I25119 Atherosclerotic heart disease of native coronary artery with unspecified angina pectoris: Secondary | ICD-10-CM | POA: Diagnosis not present

## 2023-07-21 DIAGNOSIS — I480 Paroxysmal atrial fibrillation: Secondary | ICD-10-CM | POA: Diagnosis not present

## 2023-07-21 DIAGNOSIS — E782 Mixed hyperlipidemia: Secondary | ICD-10-CM | POA: Diagnosis not present

## 2023-07-21 NOTE — Progress Notes (Signed)
    Cardiology Office Note  Date: 07/21/2023   ID: Christophere, Hillhouse 02-17-1956, MRN [id number]  History of Present Illness: [name] is a 68 y.o. male last seen in September 2024.  He is here for a routine visit.  Reports no angina or interval nitroglycerin use.  Stable NYHA class II dyspnea, no orthopnea or PND, no leg swelling.  I reviewed his medications.  He reports compliance with therapy.  No spontaneous bleeding problems reported on Eliquis.  He will be having follow-up lab work with PCP in the next month.  We discussed an echocardiogram prior to his next visit.  Physical Exam: VS:  BP 120/60   Pulse 61   Ht 5\' 5"  (1.651 m)   Wt 149 lb 9.6 oz (67.9 kg)   SpO2 97%   BMI 24.89 kg/m , BMI Body mass index is 24.89 kg/m.  Wt Readings from Last 3 Encounters:  07/21/23 149 lb 9.6 oz (67.9 kg)  01/17/23 147 lb 3.2 oz (66.8 kg)  07/11/22 151 lb 12.8 oz (68.9 kg)    General: Patient appears comfortable at rest. HEENT: Conjunctiva and lids normal. Neck: Supple, no elevated JVP or carotid bruits. Lungs: Clear to auscultation, nonlabored breathing at rest. Cardiac: Regular rate and rhythm, no S3 or significant systolic murmur. Extremities: No pitting edema.  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  Labwork:  March 2024: Hemoglobin 15.9, platelets 289, BUN 25, creatinine 1.11, potassium 5.3, AST 17, ALT 26, cholesterol 160, triglycerides 108, HDL 42, LDL 98, TSH 2.41   Other Studies Reviewed Today:  No interval cardiac testing for review today.  Assessment and Plan:  1.  Multivessel CAD status post CABG in 2009 including LIMA to LAD, SVG to D1/OM1/OM 2, SVG to D2, and SVG to PDA.  Cardiac catheterization in 2020 revealed patent LIMA to LAD with occlusion of all vein grafts.  There was collateral filling of the first and second diagonal vessels from the LAD via the LIMA and also  left to right collaterals to the PDA.  He was managed medically.  No interval angina or nitroglycerin use.  Not on aspirin given use of Eliquis.  Continue Lipitor 80 mg daily and as needed nitroglycerin.   2.  HFmrEF with ischemic cardiomyopathy, LVEF 40 to 45% by echocardiogram in May 2024.  RV contraction normal.  Clinically stable with no fluid retention, NYHA class I-II symptoms.  Continue Coreg 3.125 mg twice daily, Farxiga 5 mg daily, Lasix 20 mg daily, and Entresto 24/26 mg twice daily.  Update echocardiogram for next visit.   3.  Mitral regurgitation, trivial by follow-up echocardiogram in May 2024.   4.  Paroxysmal atrial fibrillation with previous history of typical atrial flutter ablation in 2020.  CHA2DS2-VASc score is 3.  No palpitations.  Continue amiodarone 100 mg daily and Eliquis 5 mg twice daily.   5.  Mixed hyperlipidemia on high-dose Lipitor.  He did not start Repatha due to high cost.  Fortunately, LDL has improved significantly, most recently 98 down from the 150s as of March 2024.  Obtain follow-up lab work with PCP as scheduled.  Could consider addition of bempedoic acid next if necessary.  Disposition:  Follow up  6 months.  Signed, Jonelle Sidle, M.D., F.A.C.C.  [facility]

## 2023-07-21 NOTE — Patient Instructions (Addendum)
 Medication Instructions:  Your physician recommends that you continue on your current medications as directed. Please refer to the Current Medication list given to you today.  Labwork: none  Testing/Procedures: Your physician has requested that you have an echocardiogram in 6 months just before your next visit. Echocardiography is a painless test that uses sound waves to create images of your heart. It provides your doctor with information about the size and shape of your heart and how well your heart's chambers and valves are working. This procedure takes approximately one hour. There are no restrictions for this procedure. Please do NOT wear cologne, perfume, aftershave, or lotions (deodorant is allowed). Please arrive 15 minutes prior to your appointment time.  Please note: We ask at that you not bring children with you during ultrasound (echo/ vascular) testing. Due to room size and safety concerns, children are not allowed in the ultrasound rooms during exams. Our front office staff cannot provide observation of children in our lobby area while testing is being conducted. An adult accompanying a patient to their appointment will only be allowed in the ultrasound room at the discretion of the ultrasound technician under special circumstances. We apologize for any inconvenience.  Follow-Up: Your physician recommends that you schedule a follow-up appointment in: 6 months  Any Other Special Instructions Will Be Listed Below (If Applicable).  If you need a refill on your cardiac medications before your next appointment, please call your pharmacy.

## 2023-07-28 DIAGNOSIS — I10 Essential (primary) hypertension: Secondary | ICD-10-CM | POA: Diagnosis not present

## 2023-07-28 DIAGNOSIS — Z1331 Encounter for screening for depression: Secondary | ICD-10-CM | POA: Diagnosis not present

## 2023-07-28 DIAGNOSIS — R5383 Other fatigue: Secondary | ICD-10-CM | POA: Diagnosis not present

## 2023-07-28 DIAGNOSIS — Z299 Encounter for prophylactic measures, unspecified: Secondary | ICD-10-CM | POA: Diagnosis not present

## 2023-07-28 DIAGNOSIS — Z1339 Encounter for screening examination for other mental health and behavioral disorders: Secondary | ICD-10-CM | POA: Diagnosis not present

## 2023-07-28 DIAGNOSIS — Z7189 Other specified counseling: Secondary | ICD-10-CM | POA: Diagnosis not present

## 2023-07-28 DIAGNOSIS — Z79899 Other long term (current) drug therapy: Secondary | ICD-10-CM | POA: Diagnosis not present

## 2023-07-28 DIAGNOSIS — E7800 Pure hypercholesterolemia, unspecified: Secondary | ICD-10-CM | POA: Diagnosis not present

## 2023-07-28 DIAGNOSIS — Z125 Encounter for screening for malignant neoplasm of prostate: Secondary | ICD-10-CM | POA: Diagnosis not present

## 2023-07-28 DIAGNOSIS — I5022 Chronic systolic (congestive) heart failure: Secondary | ICD-10-CM | POA: Diagnosis not present

## 2023-07-28 DIAGNOSIS — Z0000 Encounter for general adult medical examination without abnormal findings: Secondary | ICD-10-CM | POA: Diagnosis not present

## 2023-07-29 ENCOUNTER — Encounter: Payer: Self-pay | Admitting: Internal Medicine

## 2023-08-15 DIAGNOSIS — D6869 Other thrombophilia: Secondary | ICD-10-CM | POA: Diagnosis not present

## 2023-08-15 DIAGNOSIS — I10 Essential (primary) hypertension: Secondary | ICD-10-CM | POA: Diagnosis not present

## 2023-08-15 DIAGNOSIS — Z299 Encounter for prophylactic measures, unspecified: Secondary | ICD-10-CM | POA: Diagnosis not present

## 2023-08-15 DIAGNOSIS — E1165 Type 2 diabetes mellitus with hyperglycemia: Secondary | ICD-10-CM | POA: Diagnosis not present

## 2023-08-15 DIAGNOSIS — I5022 Chronic systolic (congestive) heart failure: Secondary | ICD-10-CM | POA: Diagnosis not present

## 2023-08-15 DIAGNOSIS — I4891 Unspecified atrial fibrillation: Secondary | ICD-10-CM | POA: Diagnosis not present

## 2023-09-28 ENCOUNTER — Other Ambulatory Visit: Payer: Self-pay | Admitting: Cardiology

## 2023-11-14 ENCOUNTER — Other Ambulatory Visit: Payer: Self-pay | Admitting: Cardiology

## 2023-11-25 ENCOUNTER — Other Ambulatory Visit: Payer: Self-pay | Admitting: Cardiology

## 2023-11-25 NOTE — Telephone Encounter (Signed)
 Prescription refill request for Eliquis  received. Indication: PAF Last office visit: 07/21/23  GORMAN Sierras MD Scr: 1.18 on 07/28/23  Epic Age: 68 Weight: 67.9kg  Based on above findings Eliquis  5mg  twice daily is the appropriate dose.  Refill approved.

## 2023-11-27 DIAGNOSIS — I5022 Chronic systolic (congestive) heart failure: Secondary | ICD-10-CM | POA: Diagnosis not present

## 2023-11-27 DIAGNOSIS — I10 Essential (primary) hypertension: Secondary | ICD-10-CM | POA: Diagnosis not present

## 2023-11-27 DIAGNOSIS — Z299 Encounter for prophylactic measures, unspecified: Secondary | ICD-10-CM | POA: Diagnosis not present

## 2023-11-27 DIAGNOSIS — E119 Type 2 diabetes mellitus without complications: Secondary | ICD-10-CM | POA: Diagnosis not present

## 2023-12-11 ENCOUNTER — Other Ambulatory Visit: Payer: Self-pay | Admitting: Cardiology

## 2023-12-17 DIAGNOSIS — Z1211 Encounter for screening for malignant neoplasm of colon: Secondary | ICD-10-CM | POA: Diagnosis not present

## 2023-12-17 DIAGNOSIS — Z1212 Encounter for screening for malignant neoplasm of rectum: Secondary | ICD-10-CM | POA: Diagnosis not present

## 2024-01-05 ENCOUNTER — Ambulatory Visit: Payer: Self-pay | Admitting: Cardiology

## 2024-01-05 ENCOUNTER — Ambulatory Visit: Attending: Cardiology

## 2024-01-05 DIAGNOSIS — I5022 Chronic systolic (congestive) heart failure: Secondary | ICD-10-CM

## 2024-01-05 DIAGNOSIS — E119 Type 2 diabetes mellitus without complications: Secondary | ICD-10-CM | POA: Diagnosis not present

## 2024-01-05 LAB — ECHOCARDIOGRAM COMPLETE
AR max vel: 3.62 cm2
AV Peak grad: 5 mmHg
Ao pk vel: 1.12 m/s
Area-P 1/2: 5.31 cm2
Calc EF: 43.4 %
MV M vel: 4.8 m/s
MV Peak grad: 92.2 mmHg
S' Lateral: 3.8 cm
Single Plane A2C EF: 46.6 %
Single Plane A4C EF: 35.1 %

## 2024-01-07 ENCOUNTER — Ambulatory Visit: Attending: Cardiology | Admitting: Cardiology

## 2024-01-07 ENCOUNTER — Encounter: Payer: Self-pay | Admitting: Cardiology

## 2024-01-07 VITALS — BP 118/62 | HR 59 | Ht 65.0 in | Wt 148.4 lb

## 2024-01-07 DIAGNOSIS — I5022 Chronic systolic (congestive) heart failure: Secondary | ICD-10-CM | POA: Diagnosis not present

## 2024-01-07 DIAGNOSIS — E782 Mixed hyperlipidemia: Secondary | ICD-10-CM | POA: Diagnosis not present

## 2024-01-07 DIAGNOSIS — I25119 Atherosclerotic heart disease of native coronary artery with unspecified angina pectoris: Secondary | ICD-10-CM | POA: Diagnosis not present

## 2024-01-07 DIAGNOSIS — I480 Paroxysmal atrial fibrillation: Secondary | ICD-10-CM

## 2024-01-07 MED ORDER — EZETIMIBE 10 MG PO TABS: 10.0000 mg | ORAL_TABLET | Freq: Every day | ORAL | 3 refills | Status: AC

## 2024-01-07 NOTE — Progress Notes (Signed)
 Cardiology Office Note  Date: 01/07/2024   ID: [name], DOB 09-Jan-1956, MRN [id number]  History of Present Illness: [name] is a 68 y.o. male last seen in March.  He is here today with his wife for a follow-up visit.  He has been doing well, NYHA class I-II dyspnea, no angina, no palpitations or syncope.  He does not report any fluid retention, no orthopnea or PND.  Most recently working on a [employer].  Medications reviewed.  He reports compliance with current regimen, no obvious intolerances.  His LDL was down to 81 in April on Lipitor  80 mg daily.  We discussed addition of Zetia .  I went over his echocardiogram from yesterday, LVEF stable in the range of 40 to 45% and RV contraction normal.  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  Physical Exam: VS:  BP 118/62 (BP Location: Left Arm)   Pulse (!) 59   Ht 5' 5 (1.651 m)   Wt 148 lb 6.4 oz (67.3 kg)   SpO2 98%   BMI 24.70 kg/m , BMI Body mass index is 24.7 kg/m.  Wt Readings from Last 3 Encounters:  01/07/24 148 lb 6.4 oz (67.3 kg)  07/21/23 149 lb 9.6 oz (67.9 kg)  01/17/23 147 lb 3.2 oz (66.8 kg)    General: Patient appears comfortable at rest. HEENT: Conjunctiva and lids normal. Neck: Supple, no elevated JVP or carotid bruits. Lungs: Clear to auscultation, nonlabored breathing at rest. Cardiac: Regular rate and rhythm, no S3 or significant systolic murmur. Extremities: No pitting edema.  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  Labwork:  April 2025: Hemoglobin 15.3, platelets 240, BUN 17, creatinine 1.18, GFR 68, potassium 4.7, AST 21, ALT 30, cholesterol 141, triglycerides 76, HDL 45, LDL 81, TSH 1.38  Other Studies Reviewed Today:  Echocardiogram 01/05/2024:  1. Left ventricular ejection fraction, by estimation, is 40 to 45%. The  left ventricle has mildly  decreased function. The left ventricle  demonstrates regional wall motion abnormalities (see scoring  diagram/findings for description). There is mild  concentric left ventricular hypertrophy. Left ventricular diastolic  parameters are consistent with Grade I diastolic dysfunction (impaired  relaxation). The average left ventricular global longitudinal strain is  -16.0 %. The global longitudinal strain is  normal.   2. Right ventricular systolic function is normal. The right ventricular  size is normal. There is normal pulmonary artery systolic pressure. The  estimated right ventricular systolic pressure is 17.3 mmHg.   3. Left atrial size was mild to moderately dilated.   4. The mitral valve is degenerative. Mild mitral valve regurgitation.   5. The tricuspid valve is degenerative.   6. The aortic valve is tricuspid. Aortic valve regurgitation is not  visualized.   7. Aortic dilatation noted. There is mild dilatation of the ascending  aorta, measuring 38 mm.   8. The inferior vena cava is normal in size with greater than 50%  respiratory variability, suggesting right atrial pressure of 3 mmHg.   Assessment and Plan:  1.  Multivessel CAD status post CABG in 2009 including LIMA to LAD, SVG to D1/OM1/OM 2, SVG to D2, and SVG to PDA.  Cardiac catheterization in 2020 revealed patent LIMA to LAD with occlusion of all vein grafts.  There was collateral filling of the first and second diagonal vessels from the LAD via the LIMA  and also left to right collaterals to the PDA.  He was managed medically.  No interval angina or increasing dyspnea beyond NYHA class I-II.  No change in functional status.  I reviewed his ECG.  He has as needed nitroglycerin  available.   2.  HFmrEF with ischemic cardiomyopathy, LVEF stable at 40 to 45% by recent follow-up echocardiogram.  Continues to do very well, no fluid retention or weight change.  Continue Coreg  3.125 mg twice daily, Farxiga 5 mg daily, Lasix  20 mg  daily, and Entresto  24/26 mg twice daily.   3.  Mitral regurgitation, mild by recent follow-up echocardiogram.   4.  Paroxysmal atrial fibrillation with previous history of typical atrial flutter ablation in 2020.  CHA2DS2-VASc score is 3.  He is doing well with no interval palpitations.  Continue amiodarone  1 100 mg daily for rhythm suppression.  He is on Eliquis  5 mg twice daily for stroke prophylaxis.  No spontaneous bleeding problems.   5.  Mixed hyperlipidemia on high-dose Lipitor .  LDL down to 81 in April.  Plan to add Zetia  10 mg daily to get LDL at goal.  Disposition:  Follow up 6 months.  Signed, [name], M.D., F.A.C.C. [facility]

## 2024-01-07 NOTE — Patient Instructions (Addendum)
 Medication Instructions:  Your physician has recommended you make the following change in your medication:  Start zetia 10 mg daily Continue all other medications as prescribed  Labwork: none  Testing/Procedures: none  Follow-Up: Your physician recommends that you schedule a follow-up appointment in: 6 months  Any Other Special Instructions Will Be Listed Below (If Applicable).  If you need a refill on your cardiac medications before your next appointment, please call your pharmacy.

## 2024-01-20 ENCOUNTER — Other Ambulatory Visit

## 2024-02-24 ENCOUNTER — Other Ambulatory Visit: Payer: Self-pay | Admitting: Cardiology

## 2024-05-08 ENCOUNTER — Other Ambulatory Visit: Payer: Self-pay | Admitting: Cardiology

## 2024-05-25 ENCOUNTER — Other Ambulatory Visit: Payer: Self-pay | Admitting: Cardiology

## 2024-05-25 NOTE — Telephone Encounter (Signed)
 Pt last saw Dr Debera 01/07/24, last labs 07/28/23 Creat 1.18, age 69, weight 67.3kg, based on specified criteria pt is on appropriate dosage of Eliquis  5mg  BID for afib.  Will refill rx.

## 2024-07-12 ENCOUNTER — Ambulatory Visit: Admitting: Cardiology
